# Patient Record
Sex: Male | Born: 1945 | Race: White | Hispanic: No | Marital: Married | Smoking: Current every day smoker
Health system: Southern US, Community
[De-identification: ages and names within clinical notes are randomized; demographics above are authoritative.]

## PROBLEM LIST (undated history)

## (undated) DIAGNOSIS — G4730 Sleep apnea, unspecified: Secondary | ICD-10-CM

## (undated) DIAGNOSIS — E785 Hyperlipidemia, unspecified: Secondary | ICD-10-CM

## (undated) DIAGNOSIS — K219 Gastro-esophageal reflux disease without esophagitis: Secondary | ICD-10-CM

## (undated) DIAGNOSIS — I10 Essential (primary) hypertension: Secondary | ICD-10-CM

## (undated) DIAGNOSIS — Z8719 Personal history of other diseases of the digestive system: Secondary | ICD-10-CM

## (undated) DIAGNOSIS — M1990 Unspecified osteoarthritis, unspecified site: Secondary | ICD-10-CM

## (undated) HISTORY — DX: Hyperlipidemia, unspecified: E78.5

## (undated) HISTORY — DX: Essential (primary) hypertension: I10

## (undated) HISTORY — DX: Sleep apnea, unspecified: G47.30

## (undated) HISTORY — DX: Personal history of other diseases of the digestive system: Z87.19

## (undated) HISTORY — DX: Unspecified osteoarthritis, unspecified site: M19.90

## (undated) HISTORY — DX: Gastro-esophageal reflux disease without esophagitis: K21.9

---

## 2003-01-09 ENCOUNTER — Ambulatory Visit (HOSPITAL_COMMUNITY): Admission: RE | Admit: 2003-01-09 | Discharge: 2003-01-09 | Payer: Self-pay | Admitting: Family Medicine

## 2003-01-09 ENCOUNTER — Encounter: Payer: Self-pay | Admitting: Family Medicine

## 2003-04-26 ENCOUNTER — Encounter: Admission: RE | Admit: 2003-04-26 | Discharge: 2003-04-26 | Payer: Self-pay | Admitting: Family Medicine

## 2005-03-06 ENCOUNTER — Encounter: Admission: RE | Admit: 2005-03-06 | Discharge: 2005-03-06 | Payer: Self-pay | Admitting: Orthopedic Surgery

## 2007-10-20 ENCOUNTER — Ambulatory Visit: Payer: Self-pay | Admitting: Cardiology

## 2008-05-31 ENCOUNTER — Encounter (INDEPENDENT_AMBULATORY_CARE_PROVIDER_SITE_OTHER): Payer: Self-pay | Admitting: *Deleted

## 2008-08-16 ENCOUNTER — Ambulatory Visit: Payer: Self-pay | Admitting: Gastroenterology

## 2008-08-29 ENCOUNTER — Ambulatory Visit: Payer: Self-pay | Admitting: Gastroenterology

## 2008-08-29 ENCOUNTER — Encounter: Payer: Self-pay | Admitting: Gastroenterology

## 2008-09-03 ENCOUNTER — Encounter: Payer: Self-pay | Admitting: Gastroenterology

## 2009-05-23 ENCOUNTER — Encounter
Admission: RE | Admit: 2009-05-23 | Discharge: 2009-05-23 | Payer: Self-pay | Admitting: Physical Medicine and Rehabilitation

## 2009-06-14 ENCOUNTER — Encounter: Admission: RE | Admit: 2009-06-14 | Discharge: 2009-09-12 | Payer: Self-pay | Admitting: Specialist

## 2009-08-14 ENCOUNTER — Encounter: Payer: Self-pay | Admitting: Cardiology

## 2009-08-14 DIAGNOSIS — G4730 Sleep apnea, unspecified: Secondary | ICD-10-CM | POA: Insufficient documentation

## 2009-08-14 DIAGNOSIS — K219 Gastro-esophageal reflux disease without esophagitis: Secondary | ICD-10-CM

## 2009-08-14 DIAGNOSIS — E785 Hyperlipidemia, unspecified: Secondary | ICD-10-CM | POA: Insufficient documentation

## 2009-08-14 DIAGNOSIS — M1990 Unspecified osteoarthritis, unspecified site: Secondary | ICD-10-CM

## 2009-08-14 DIAGNOSIS — I10 Essential (primary) hypertension: Secondary | ICD-10-CM | POA: Insufficient documentation

## 2009-08-22 ENCOUNTER — Ambulatory Visit: Payer: Self-pay | Admitting: Cardiology

## 2009-08-22 DIAGNOSIS — F17200 Nicotine dependence, unspecified, uncomplicated: Secondary | ICD-10-CM

## 2009-09-12 ENCOUNTER — Inpatient Hospital Stay (HOSPITAL_COMMUNITY): Admission: RE | Admit: 2009-09-12 | Discharge: 2009-09-14 | Payer: Self-pay | Admitting: Specialist

## 2009-10-08 ENCOUNTER — Encounter: Admission: RE | Admit: 2009-10-08 | Discharge: 2009-12-20 | Payer: Self-pay | Admitting: Specialist

## 2010-04-25 NOTE — Letter (Signed)
Summary: [facility] Surgical Clearance   [facility] Surgical Clearance   Imported By: Roderic Ovens 08/31/2009 15:47:44  _____________________________________________________________________  External Attachment:    Type:   Image     Comment:   External Document

## 2010-04-25 NOTE — Assessment & Plan Note (Signed)
Summary: Steven Serrano   Visit Type:  Follow-up Primary Provider:  Dr. Vernon Prey  CC:  Pre Op, HTN, and Hyperlipidemia.  History of Present Illness: The patient presents for preoperative evaluation. He has a history of multiple cardiovascular risk factors. He is now being considered for lumbar back surgery. He is limited in his activities. He is able to walk 50 yards before developing back pain. With this level of activity he does not develop chest discomfort, neck or arm discomfort. He does not have shortness of breath and has no resting complaints such as PND or orthopnea. He is not describing palpitations, presyncope or syncope. He said no swelling but has had weight gain from inactivity.  Allergies (verified): No Known Drug Allergies  Past History:  Past Medical History:  Hyperlipidemia x10 years, hypertension x10 years,   previous gastroesophageal reflux disease, ulcerative colitis years ago,   sleep apnea but not treated with CPAP, and osteoarthritis.      Past Surgical History: Reviewed history from 08/14/2009 and no changes required. None.   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   He is married.  He has been smoking cigarettes, most recently for 6   years.  He quit for 14 years prior to this and smoked for many years  before that.  He is currently smoking half pack a day.  He does drink  alcohol.   Review of Systems       As stated in the HPI and negative for all other systems.   Vital Signs:  Patient profile:   65 year old male Height:      69 inches Weight:      248 pounds BMI:     36.76 Pulse rate:   72 / minute Resp:     16 per minute BP sitting:   138 / 78  (right arm)  Vitals Entered By: Marrion Coy, CNA (August 22, 2009 9:43 AM)  Physical Exam  General:  Well developed, well nourished, in no acute distress. Head:  normocephalic and atraumatic Eyes:  PERRLA/EOM intact; conjunctiva and lids normal. Mouth:  Teeth,  gums and palate normal. Oral mucosa normal. Neck:  Neck supple, no JVD. No masses, thyromegaly or abnormal cervical nodes. Chest Wall:  no deformities or breast masses noted Lungs:  Clear bilaterally to auscultation and percussion. Abdomen:  Bowel sounds positive; abdomen soft and non-tender without masses, organomegaly, or hernias noted. No hepatosplenomegaly, obese Msk:  Back normal, normal gait. Muscle strength and tone normal. Extremities:  No clubbing or cyanosis. Neurologic:  Alert and oriented x 3. Skin:  Intact without lesions or rashes. Cervical Nodes:  no significant adenopathy Axillary Nodes:  no significant adenopathy Inguinal Nodes:  no significant adenopathy Psych:  Normal affect.   Detailed Cardiovascular Exam  Neck    Carotids: Carotids full and equal bilaterally without bruits.      Neck Veins: Normal, no JVD.    Heart    Inspection: no deformities or lifts noted.      Palpation: normal PMI with no thrills palpable.      Auscultation: regular rate and rhythm, S1, S2 without murmurs, rubs, gallops, or clicks.    Vascular    Abdominal Aorta: no palpable masses, pulsations, or audible bruits.      Femoral Pulses: normal femoral pulses bilaterally.      Pedal Pulses: normal pedal pulses bilaterally.      Radial Pulses: normal radial pulses bilaterally.  Peripheral Circulation: no clubbing, cyanosis, or edema noted with normal capillary refill.     EKG  Procedure date:  08/22/2009  Findings:      sinus rhythm, rate 75, axis within normal limits, intervals within normal limits, early transition in lead V2, no acute ST-T wave changes.  Impression & Recommendations:  Problem # 1:  PREOPERATIVE EXAMINATION (ICD-V72.84) According to ACC/AHA guidelines the patient has no high-risk findings. The surgery he is scheduled for is a moderate risk procedure. He does have a low functional level. According to the algorithm no further testing is indicated. The patient  would be at acceptable risk for the planned surgery with routine precautions. He and I did have a discussion that he use tobacco, sedentary lifestyle, hypertension and dyslipidemia gives him a moderate risk 5 year possibility of cardiovascular events.  Problem # 2:  HYPERTENSION (ICD-401.9) His blood pressure is controlled and he will continue the meds as listed.  Problem # 3:  HYPERLIPIDEMIA (ICD-272.4) I reviewed his lipids. His HDL was 37 and LDL 99. This is being managed by Dr. Christell Constant.  Problem # 4:  TOBACCO ABUSE (ICD-305.1) I discussed the need to stop smoking. The patient wants to try cold [place].

## 2010-06-09 LAB — CBC
HCT: 46.8 % (ref 39.0–52.0)
Hemoglobin: 15.7 g/dL (ref 13.0–17.0)
MCH: 29.7 pg (ref 26.0–34.0)
MCHC: 33.5 g/dL (ref 30.0–36.0)
MCV: 89.6 fL (ref 78.0–100.0)
Platelets: 157 10*3/uL (ref 150–400)
RBC: 4.64 MIL/uL (ref 4.22–5.81)
RDW: 13 % (ref 11.5–15.5)
WBC: 8.7 10*3/uL (ref 4.0–10.5)

## 2010-06-09 LAB — COMPREHENSIVE METABOLIC PANEL
Alkaline Phosphatase: 53 U/L (ref 39–117)
CO2: 25 mEq/L (ref 19–32)
Creatinine, Ser: 0.97 mg/dL (ref 0.4–1.5)
GFR calc Af Amer: >60 mL/min (ref >60)
GFR calc non Af Amer: >60 mL/min (ref >60)
Glucose, Bld: 112 mg/dL — ABNORMAL HIGH (ref 70–99)
Sodium: 141 mEq/L (ref 135–145)
Total Protein: 6.9 g/dL (ref 6.0–8.3)

## 2010-06-09 LAB — URINALYSIS, ROUTINE W REFLEX MICROSCOPIC
Glucose, UA: NEGATIVE mg/dL
Ketones, ur: NEGATIVE mg/dL
Nitrite: NEGATIVE
Protein, ur: NEGATIVE mg/dL
pH: 7 (ref 5.0–8.0)

## 2010-06-09 LAB — PROTIME-INR
INR: 1.13 (ref 0.00–1.49)
Prothrombin Time: 14.4 seconds (ref 11.6–15.2)

## 2010-06-09 LAB — BASIC METABOLIC PANEL
Calcium: 8.9 mg/dL (ref 8.4–10.5)
Creatinine, Ser: 0.94 mg/dL (ref 0.4–1.5)
Glucose, Bld: 134 mg/dL — ABNORMAL HIGH (ref 70–99)
Sodium: 137 mEq/L (ref 135–145)

## 2010-06-09 LAB — APTT: aPTT: 29 seconds (ref 24–37)

## 2010-06-09 LAB — SURGICAL PCR SCREEN
MRSA, PCR: NEGATIVE
Staphylococcus aureus: NEGATIVE

## 2010-08-06 NOTE — Assessment & Plan Note (Signed)
[facility]                            CARDIOLOGY OFFICE NOTE   Steven Serrano, Steven Serrano                       MRN:          [id number]  DATE:10/20/2007                            DOB:          Aug 05, 1945    PRIMARY CARE PHYSICIAN:  Ernestina Penna, MD.   REASON FOR PRESENTATION:  Evaluate patient with abnormal EKG and  multiple cardiovascular risk factors.   HISTORY OF PRESENT ILLNESS:  The patient presents for followup.  I last  saw him in 2004.  He is having some dyspnea and multiple cardiovascular  risk factors.  He did have a stress perfusion study, demonstrated an EF  of 60% with no evidence of ischemia or infarct.  He was recently found  to have an EKG with incomplete right bundle-branch block; this was  thought to be new.  Because of this and his ongoing multiple  cardiovascular risk factors, he is referred back.   The patient has had no new symptoms since I last saw him.  He is not  particularly active.  He was golfing, but not walking in the golf  course.  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  He is limited by some  back pain, which is recent onset, and some chronic knee pain.  I do not  think he does a lot of activity but could climb a flight of stairs if  needed.  With this level of activity, he denies any chest pressure, neck  discomfort, arm discomfort, activity-induced nausea, vomiting, or  excessive diaphoresis.  He does not have any palpitations, presyncope,  or syncope.  He does not have any PND or orthopnea.   The patient continues to smoke cigarettes.  He has not lost weight.  He  is having his lipids aggressively managed and his blood pressure  controlled.  He brought a blood pressure diary today, and it  demonstrates his blood pressure is very well controlled in the 110s to  130s systolic and diastolics well below 90.   PAST MEDICAL HISTORY:  Hyperlipidemia x8 years, hypertension x8 years,  previous gastroesophageal reflux  disease, ulcerative colitis years ago,  sleep apnea but not treated with CPAP, and osteoarthritis.   PAST SURGICAL HISTORY:  None.   ALLERGIES:  None.   MEDICATIONS:  Prilosec, omega-3, Aleve, Darvocet, etodolac, fenofibrate  200 mg daily, aspirin 81 mg daily, hydrochlorothiazide 12.5 mg daily,  lisinopril 20 mg daily, gabapentin, Niaspan 1000 mg at bedtime, and  simvastatin 80 mg at bedtime.   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  He is married.  He has been smoking cigarettes, most recently for 4  years.  He quit for 14 years prior to this and smoked for many years  before that.  He is currently smoking half pack a day.  He does drink  alcohol.   FAMILY HISTORY:  Noncontributory for early coronary disease.  His father  died of [place] spotted fever at 47.  His mother is still alive  at 10.  He had 1 brother only who died of  a brain hemorrhage at 19.   REVIEW OF SYSTEMS:  As stated in the HPI, otherwise negative for all  other systems.   PHYSICAL EXAMINATION:  GENERAL:  The patient is in no acute distress.  VITAL SIGNS:  Blood pressure 140/84, heart rate 63 and regular, and  weight 255 pounds.  HEENT:  Eyes unremarkable.  Pupils equal, round, and reactive to light.  Fundi not visualized.  Oral mucosa unremarkable.  NECK:  No jugular venous distention at 45 degrees.  Carotid upstroke  brisk and symmetric.  No bruits, no thyromegaly.  LYMPHATICS:  No cervical, axillary, or inguinal adenopathy.  LUNGS:  Clear to auscultation bilaterally.  BACK:  No costovertebral angle tenderness.  CHEST:  Unremarkable.  HEART:  PMI not displaced or sustained.  S1 and S2 within normal limits.  No S3, no S4, no clicks, no rubs.  A 2/6 apical systolic murmur,  nonradiating and early peaking and brief.  No diastolic murmur.  ABDOMEN:  Morbidly obese, positive bowel sounds normal in frequency and  pitch.  No bruits, no rebound, no guarding, no midline  pulsatile mass,  no hepatomegaly, no splenomegaly.  SKIN:  No rashes, no nodules.  EXTREMITIES:  Pulses 2+ throughout.  No edema, no cyanosis, no clubbing.  NEURO:  Oriented to person, place, and time.  Cranial nerves II through  XII grossly intact.  Motor grossly intact.   EKG - sinus bradycardia, rate 51, axis within normal limits, intervals  within normal limits, RSR prime V1 and V2, no acute ST-T wave changes.   ASSESSMENT/PLAN:  1. Abnormal electrocardiogram.  The patient's electrocardiogram is in      regular sinus rhythm prime in V1 and V2.  It does not really meet      criteria for an incomplete right bundle-branch block.  It is really      not different from the electrocardiograms I have previously.  Would      not be indicative of obstructive coronary disease.  He does not      have any symptoms consistent with obstructive coronary disease.  I      do not think cardiovascular testing is warranted.  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  I had a long and frank discussion with him about this.      Again, though I do not think cardiovascular testing would be      helpful, I do think he is at risk for acute myocardial infarction      or cardiovascular events if he does not change some things.  2. Obesity.  We discussed about the need to lose weight with diet.      Provided he can get his orthopedic problems treated, he would need      exercise as well.  3. Tobacco.  We had a long discussion (greater than 3 minutes) about      the need to stop smoking altogether.  4. Hypertension.  Blood pressure is controlled, and he will continue      the medications as listed.  5. Dyslipidemia.  I applaud Dr. Kathi Der efforts for lipid management      with this aggressive      therapy.  The patient needs to participate as well.  6. Followup.  This patient can come back to  see me as  needed.     Rollene Rotunda, MD, [facility]   Electronically Signed    JH/MedQ  DD: 10/20/2007  DT: 10/21/2007  Job #: [id number]   cc:   Ernestina Penna, M.D.

## 2010-10-30 ENCOUNTER — Encounter: Payer: Self-pay | Admitting: Cardiology

## 2010-11-25 IMAGING — CT CT CHEST W/O CM
2 of 4 series · 15 of 36 positions shown, 18 images · non-contrast
Comparison: 09/05/2009

CLINICAL DATA: Abnormal chest x-ray.  Possible pulmonary nodule.

CT CHEST WITHOUT CONTRAST
TECHNIQUE: Multidetector CT imaging of the chest was performed
following the standard protocol without IV contrast.

[Series 2: chest w/o st · axial · non-contrast · 0.89mm/px · z∈[-271,-16]mm · 12 of 61 slices shown, 15 images]
[im 5/61  mediastinal]
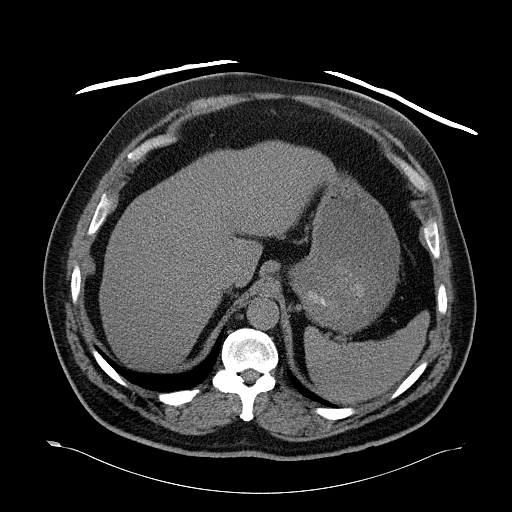
[im 5/61  lung]
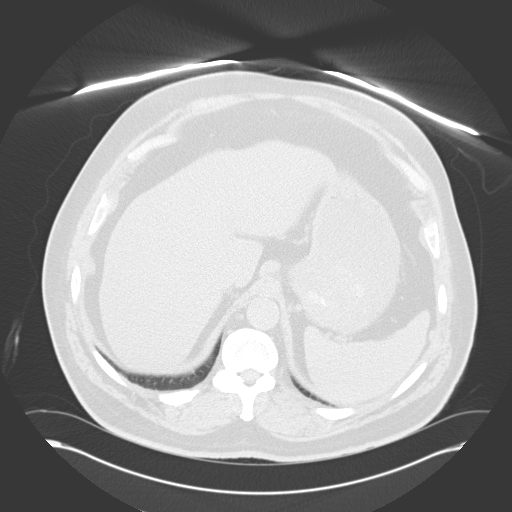
[im 10/61  lung]
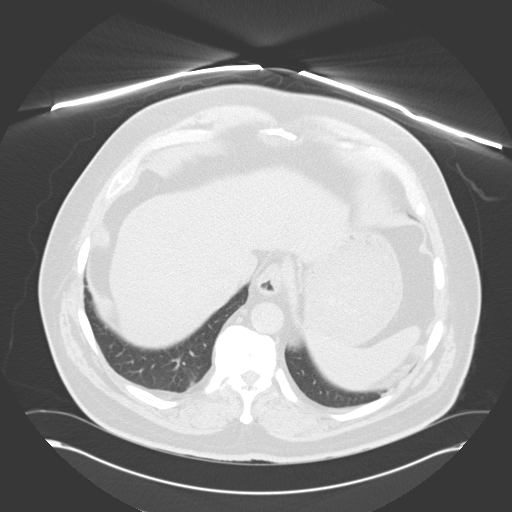
[im 14/61  lung]
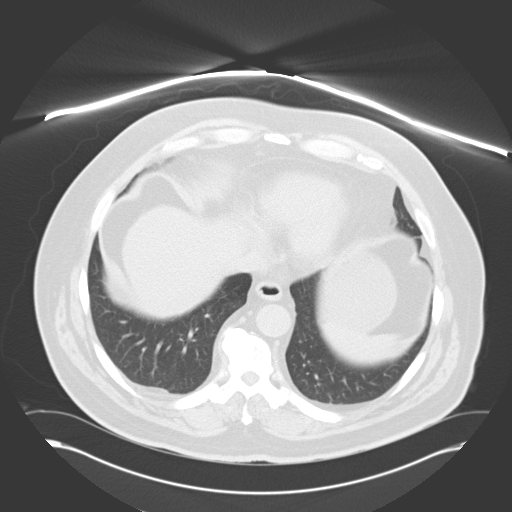
[im 19/61  lung]
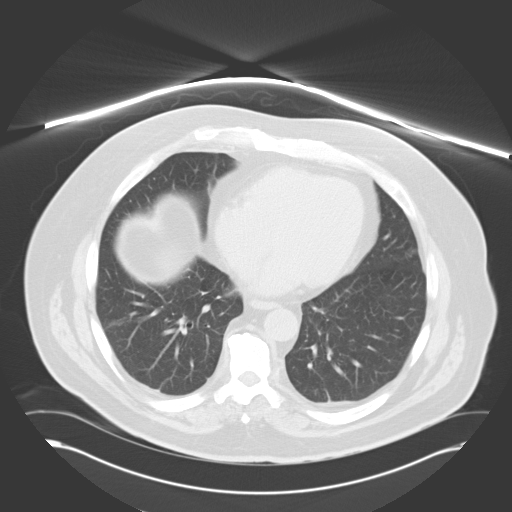
[im 24/61  mediastinal]
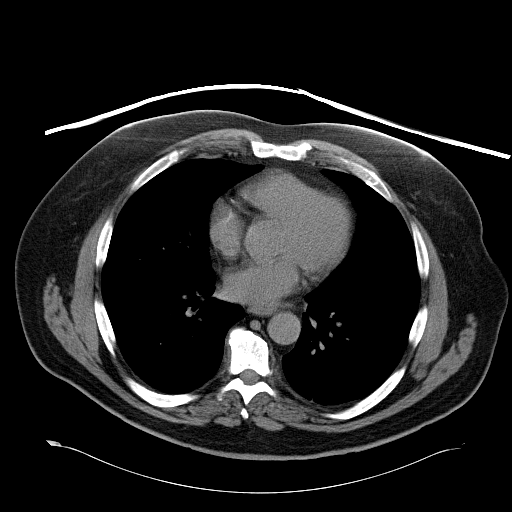
[im 24/61  lung]
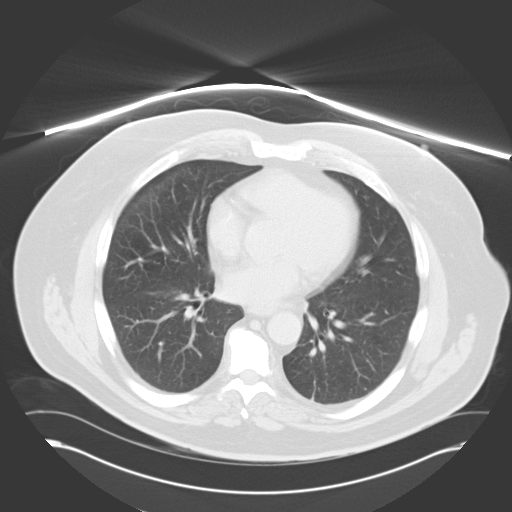
[im 28/61  lung]
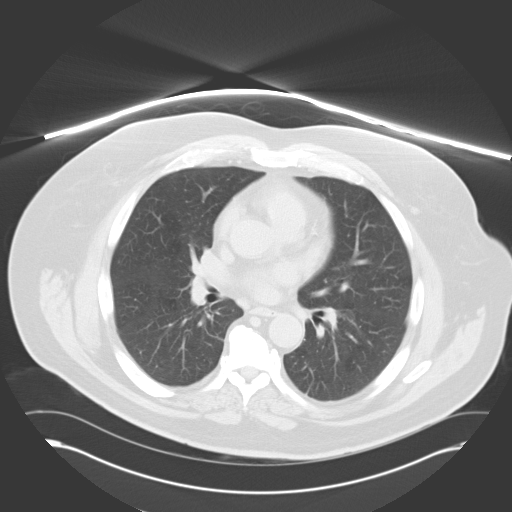
[im 33/61  lung]
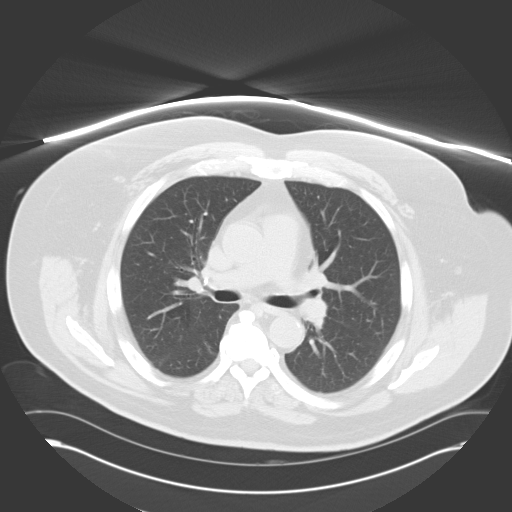
[im 37/61  lung]
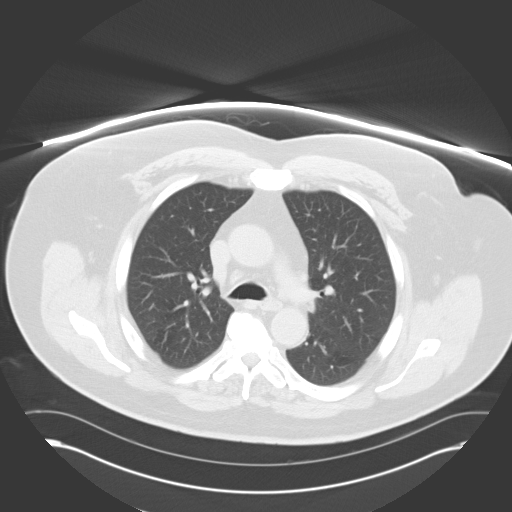
[im 42/61  mediastinal]
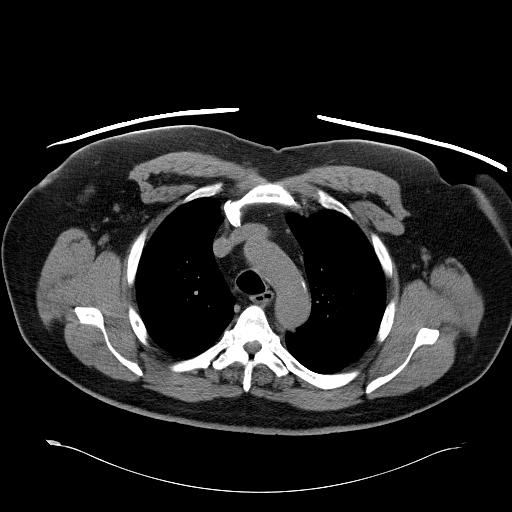
[im 42/61  lung]
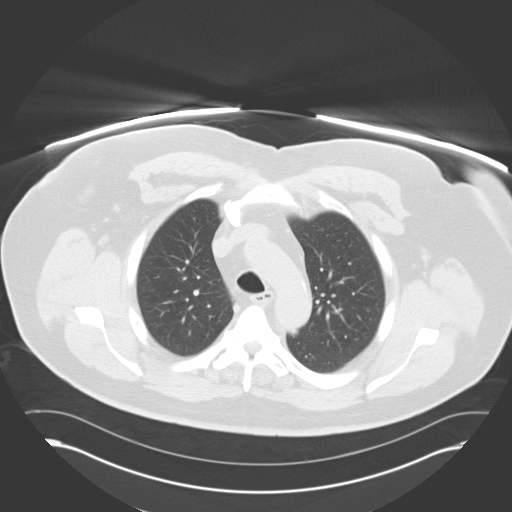
[im 47/61  lung]
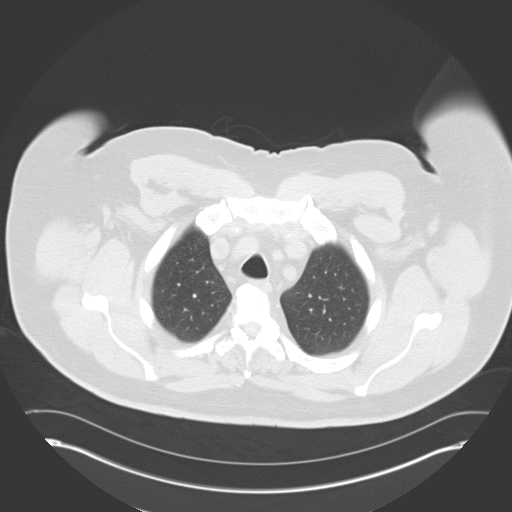
[im 51/61  lung]
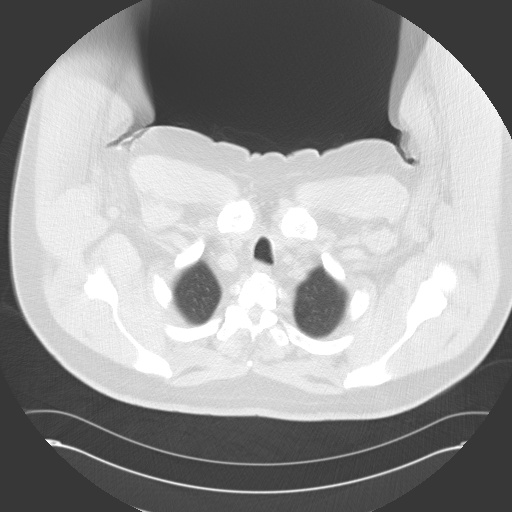
[im 56/61  lung]
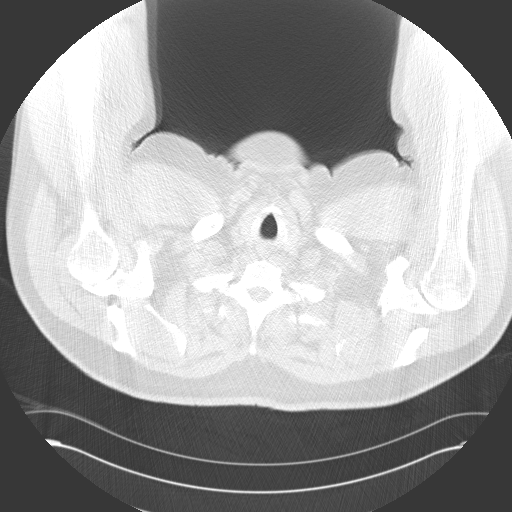

[Series 602: <mpr thick range> · coronal · 0.89mm/px · 3 of 102 slices shown]
[im 21/102  lung]
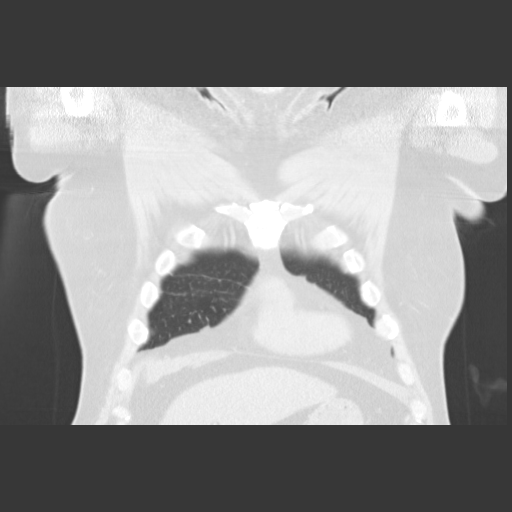
[im 41/102  lung]
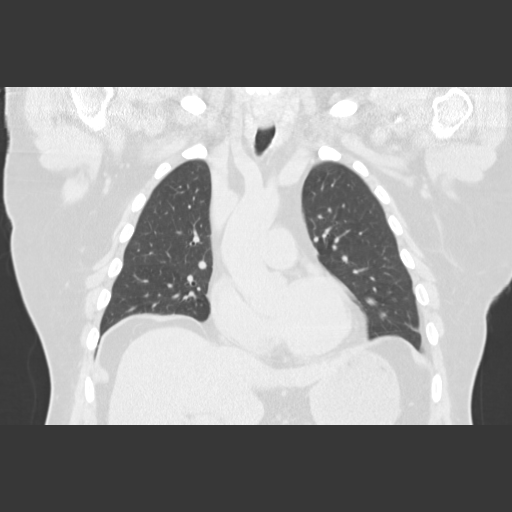
[im 61/102  lung]
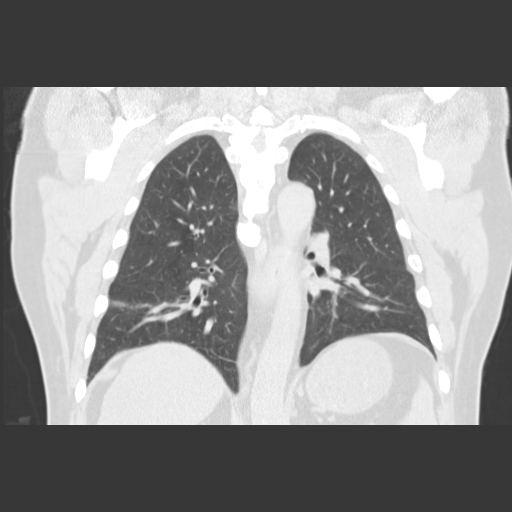

[15 of 36 positions shown; findings below may reference images not displayed]

FINDINGS: There are no pulmonary nodules within the lungs.  In
particular, no nodules seen in the left upper lobe.  There is a
sclerotic focus within the posterior left fourth rib, likely a bone
island.  The density questioned on prior chest x-ray corresponds to
this lesion.  Linear densities are seen in both lower lobes and
lung bases, which represent subsegmental atelectasis or scarring.
Calcified lymph nodes in the right hilum compatible with old
granulomatous disease.  No mediastinal, hilar, or axillary
adenopathy.  Heart is normal size. Aorta is normal caliber.
Visualized thyroid and chest wall soft tissues unremarkable.

Imaging into the upper abdomen shows no acute findings.  Flowing
anterior osteophytes are noted throughout the thoracic spine
compatible with diffuse idiopathic skeletal hyperostosis.  No acute
bony abnormality.
IMPRESSION: No evidence of pulmonary nodule, in particular in the area
questioned in the left upper lobe.  There is a bone island in the
posterior left fourth rib causing the nodular density on chest x-
ray.

No acute findings in the chest.

## 2013-12-20 ENCOUNTER — Ambulatory Visit (INDEPENDENT_AMBULATORY_CARE_PROVIDER_SITE_OTHER): Payer: Medicare Other | Admitting: Family

## 2013-12-20 ENCOUNTER — Encounter: Payer: Self-pay | Admitting: Family

## 2013-12-20 VITALS — BP 123/74 | HR 78 | Temp 98.7°F | Ht 69.0 in | Wt 251.4 lb

## 2013-12-20 DIAGNOSIS — J069 Acute upper respiratory infection, unspecified: Secondary | ICD-10-CM

## 2013-12-20 MED ORDER — ALBUTEROL SULFATE HFA 108 (90 BASE) MCG/ACT IN AERS: 2.0000 | INHALATION_SPRAY | Freq: Four times a day (QID) | RESPIRATORY_TRACT | Status: DC | PRN

## 2013-12-20 MED ORDER — METHYLPREDNISOLONE (PAK) 4 MG PO TABS: ORAL_TABLET | ORAL | Status: DC

## 2013-12-20 MED ORDER — BENZONATATE 200 MG PO CAPS: 200.0000 mg | ORAL_CAPSULE | Freq: Three times a day (TID) | ORAL | Status: DC | PRN

## 2013-12-20 NOTE — Progress Notes (Signed)
Subjective:    Patient ID: Steven Serrano, male    DOB: Jul 22, 1945, 68 y.o.   MRN: [id number]  Cough This is a new problem. The current episode started in the past 7 days (Thursday night). The problem has been waxing and waning. The problem occurs every few minutes. The cough is non-productive. Associated symptoms include rhinorrhea, shortness of breath and wheezing. Pertinent negatives include no chills, ear congestion, ear pain, fever, headaches, hemoptysis, nasal congestion, postnasal drip or sore throat. The symptoms are aggravated by lying down. Treatments tried: Mucinex. The treatment provided mild relief. There is no history of asthma or COPD.      Review of Systems  Constitutional: Negative.  Negative for fever and chills.  HENT: Positive for rhinorrhea. Negative for ear pain, postnasal drip and sore throat.   Respiratory: Positive for cough, shortness of breath and wheezing. Negative for hemoptysis.   Cardiovascular: Negative.   Gastrointestinal: Negative.   Endocrine: Negative.   Genitourinary: Negative.   Musculoskeletal: Negative.   Neurological: Negative.  Negative for headaches.  Hematological: Negative.   Psychiatric/Behavioral: Negative.   All other systems reviewed and are negative.      Objective:   Physical Exam  Vitals reviewed. Constitutional: He is oriented to person, place, and time. He appears well-developed and well-nourished. No distress.  HENT:  Head: Normocephalic and atraumatic.  Right Ear: External ear normal.  Left Ear: External ear normal.  Nasal passage erythemas with mild swelling Oropharynx erythemas  Eyes: Pupils are equal, round, and reactive to light. Right eye exhibits no discharge. Left eye exhibits no discharge.  Neck: Normal range of motion. Neck supple. No thyromegaly present.  Cardiovascular: Normal rate, regular rhythm, normal heart sounds and intact distal pulses.   No murmur heard. Pulmonary/Chest: Effort normal and breath  sounds normal. No respiratory distress. He has no wheezes.  Abdominal: Soft. Bowel sounds are normal. He exhibits no distension. There is no tenderness.  Musculoskeletal: Normal range of motion. He exhibits no edema and no tenderness.  Neurological: He is alert and oriented to person, place, and time. He has normal reflexes. No cranial nerve deficit.  Skin: Skin is warm and dry. No rash noted. No erythema.  Psychiatric: He has a normal mood and affect. His behavior is normal. Judgment and thought content normal.    BP 123/74  Pulse 78  Temp(Src) 98.7 F (37.1 C) (Oral)  Ht 5\' 9"  (1.753 m)  Wt 251 lb 6.4 oz (114.034 kg)  BMI 37.11 kg/m2       Assessment & Plan:  1. URI (upper respiratory infection) -- Take meds as prescribed - Use a cool mist humidifier  -Use saline nose sprays frequently -Saline irrigations of the nose can be very helpful if done frequently.  * 4X daily for 1 week*  * Use of a nettie pot can be helpful with this. Follow directions with this* -Force fluids -For any cough or congestion  Use plain Mucinex- regular strength or max strength is fine   * Children- consult with Pharmacist for dosing -For fever or aces or pains- take tylenol or ibuprofen appropriate for age and weight.  * for fevers greater than 101 orally you may alternate ibuprofen and tylenol every  3 hours. -Throat lozenges if help - methylPREDNIsolone (MEDROL DOSPACK) 4 MG tablet; follow package directions  Dispense: 21 tablet; Refill: 0 - benzonatate (TESSALON) 200 MG capsule; Take 1 capsule (200 mg total) by mouth 3 (three) times daily as needed.  Dispense: 30 capsule;  Refill: 1 - albuterol (PROVENTIL HFA;VENTOLIN HFA) 108 (90 BASE) MCG/ACT inhaler; Inhale 2 puffs into the lungs every 6 (six) hours as needed for wheezing or shortness of breath.  Dispense: 1 Inhaler; Refill: 2  Jannifer Rodneyhristy Ellenora Talton, FNP

## 2013-12-20 NOTE — Patient Instructions (Signed)
Upper Respiratory Infection, Adult An upper respiratory infection (URI) is also known as the common cold. It is often caused by a type of germ (virus). Colds are easily spread (contagious). You can pass it to others by kissing, coughing, sneezing, or drinking out of the same glass. Usually, you get better in 1 or 2 weeks.  HOME CARE   Only take medicine as told by your doctor.  Use a warm mist humidifier or breathe in steam from a hot shower.  Drink enough water and fluids to keep your pee (urine) clear or pale yellow.  Get plenty of rest.  Return to work when your temperature is back to normal or as told by your doctor. You may use a face mask and wash your hands to stop your cold from spreading. GET HELP RIGHT AWAY IF:   After the first few days, you feel you are getting worse.  You have questions about your medicine.  You have chills, shortness of breath, or brown or red spit (mucus).  You have yellow or brown snot (nasal discharge) or pain in the face, especially when you bend forward.  You have a fever, puffy (swollen) neck, pain when you swallow, or white spots in the back of your throat.  You have a bad headache, ear pain, sinus pain, or chest pain.  You have a high-pitched whistling sound when you breathe in and out (wheezing).  You have a lasting cough or cough up blood.  You have sore muscles or a stiff neck. MAKE SURE YOU:   Understand these instructions.  Will watch your condition.  Will get help right away if you are not doing well or get worse. Document Released: 08/27/2007 Document Revised: 06/02/2011 Document Reviewed: 06/15/2013 ExitCare Patient Information 2015 [employer]. This information is not intended to replace advice given to you by your health care provider. Make sure you discuss any questions you have with your health care provider.  \ - Take meds as prescribed - Use a cool mist humidifier  -Use saline nose sprays frequently -Saline  irrigations of the nose can be very helpful if done frequently.  * 4X daily for 1 week*  * Use of a nettie pot can be helpful with this. Follow directions with this* -Force fluids -For any cough or congestion  Use plain Mucinex- regular strength or max strength is fine   * Children- consult with Pharmacist for dosing -For fever or aces or pains- take tylenol or ibuprofen appropriate for age and weight.  * for fevers greater than 101 orally you may alternate ibuprofen and tylenol every  3 hours. -Throat lozenges if help   Kyel Purk, FNP   

## 2013-12-24 ENCOUNTER — Ambulatory Visit (INDEPENDENT_AMBULATORY_CARE_PROVIDER_SITE_OTHER): Payer: Medicare Other | Admitting: Family

## 2013-12-24 VITALS — BP 119/62 | HR 64 | Temp 97.5°F | Ht 69.0 in | Wt 251.0 lb

## 2013-12-24 DIAGNOSIS — R059 Cough, unspecified: Secondary | ICD-10-CM

## 2013-12-24 DIAGNOSIS — J069 Acute upper respiratory infection, unspecified: Secondary | ICD-10-CM

## 2013-12-24 DIAGNOSIS — R05 Cough: Secondary | ICD-10-CM

## 2013-12-24 MED ORDER — HYDROCODONE-HOMATROPINE 5-1.5 MG/5ML PO SYRP: 5.0000 mL | ORAL_SOLUTION | Freq: Three times a day (TID) | ORAL | Status: DC | PRN

## 2013-12-24 MED ORDER — AMOXICILLIN-POT CLAVULANATE 875-125 MG PO TABS: 1.0000 | ORAL_TABLET | Freq: Two times a day (BID) | ORAL | Status: DC

## 2013-12-24 NOTE — Patient Instructions (Signed)
Upper Respiratory Infection, Adult An upper respiratory infection (URI) is also known as the common cold. It is often caused by a type of germ (virus). Colds are easily spread (contagious). You can pass it to others by kissing, coughing, sneezing, or drinking out of the same glass. Usually, you get better in 1 or 2 weeks.  HOME CARE   Only take medicine as told by your doctor.  Use a warm mist humidifier or breathe in steam from a hot shower.  Drink enough water and fluids to keep your pee (urine) clear or pale yellow.  Get plenty of rest.  Return to work when your temperature is back to normal or as told by your doctor. You may use a face mask and wash your hands to stop your cold from spreading. GET HELP RIGHT AWAY IF:   After the first few days, you feel you are getting worse.  You have questions about your medicine.  You have chills, shortness of breath, or brown or red spit (mucus).  You have yellow or brown snot (nasal discharge) or pain in the face, especially when you bend forward.  You have a fever, puffy (swollen) neck, pain when you swallow, or white spots in the back of your throat.  You have a bad headache, ear pain, sinus pain, or chest pain.  You have a high-pitched whistling sound when you breathe in and out (wheezing).  You have a lasting cough or cough up blood.  You have sore muscles or a stiff neck. MAKE SURE YOU:   Understand these instructions.  Will watch your condition.  Will get help right away if you are not doing well or get worse. Document Released: 08/27/2007 Document Revised: 06/02/2011 Document Reviewed: 06/15/2013 Exit[employer] Patient Information 2015 [employer]. This information is not intended to replace advice given to you by your health care provider. Make sure you discuss any questions you have with your health care provider.  - Take meds as prescribed - Use a cool mist humidifier  -Use saline nose sprays frequently -Saline  irrigations of the nose can be very helpful if done frequently.  * 4X daily for 1 week*  * Use of a nettie pot can be helpful with this. Follow directions with this* -Force fluids -For any cough or congestion  Use plain Mucinex- regular strength or max strength is fine   * Children- consult with Pharmacist for dosing -For fever or aces or pains- take tylenol or ibuprofen appropriate for age and weight.  * for fevers greater than 101 orally you may alternate ibuprofen and tylenol every  3 hours. -Throat lozenges if help -New toothbrush in 3 days   Christy Hawks, FNP  

## 2013-12-24 NOTE — Progress Notes (Signed)
Subjective:    Patient ID: Steven Serrano, male    DOB: 10-04-1945, 68 y.o.   MRN: [id number]  Pt was seen in office on 12/20/13 and states he has gotten worse.  Cough This is a recurrent problem. The current episode started 1 to 4 weeks ago. The problem has been waxing and waning. The problem occurs constantly. The cough is productive of sputum. Associated symptoms include postnasal drip and rhinorrhea. Pertinent negatives include no chills, ear congestion, ear pain, fever, headaches, nasal congestion, rash, sore throat, shortness of breath or wheezing. The symptoms are aggravated by lying down. He has tried rest, prescription cough suppressant, oral steroids and leukotriene antagonists for the symptoms. The treatment provided mild relief. There is no history of asthma or COPD.      Review of Systems  Constitutional: Negative.  Negative for fever and chills.  HENT: Positive for postnasal drip and rhinorrhea. Negative for ear pain and sore throat.   Respiratory: Positive for cough. Negative for shortness of breath and wheezing.   Cardiovascular: Negative.   Gastrointestinal: Negative.   Endocrine: Negative.   Genitourinary: Negative.   Musculoskeletal: Negative.   Skin: Negative for rash.  Neurological: Negative.  Negative for headaches.  Hematological: Negative.   Psychiatric/Behavioral: Negative.   All other systems reviewed and are negative.      Objective:   Physical Exam  Vitals reviewed. Constitutional: He is oriented to person, place, and time. He appears well-developed and well-nourished. No distress.  HENT:  Head: Normocephalic.  Right Ear: External ear normal.  Left Ear: External ear normal.  Nasal passage erythemas with mild swelling Oropharynx erythemas  Eyes: Pupils are equal, round, and reactive to light. Right eye exhibits no discharge. Left eye exhibits no discharge.  Neck: Normal range of motion. Neck supple. No thyromegaly present.  Cardiovascular: Normal  rate, regular rhythm, normal heart sounds and intact distal pulses.   No murmur heard. Pulmonary/Chest: Effort normal. No respiratory distress. He has wheezes.  Abdominal: Soft. Bowel sounds are normal. He exhibits no distension. There is no tenderness.  Musculoskeletal: Normal range of motion. He exhibits no edema and no tenderness.  Neurological: He is alert and oriented to person, place, and time. He has normal reflexes. No cranial nerve deficit.  Skin: Skin is warm and dry. No rash noted. No erythema.  Psychiatric: He has a normal mood and affect. His behavior is normal. Judgment and thought content normal.   BP 119/62  Pulse 64  Temp(Src) 97.5 F (36.4 C) (Oral)  Ht 5\' 9"  (1.753 m)  Wt 251 lb (113.853 kg)  BMI 37.05 kg/m2  SpO2 97%    Assessment & Plan:  1. URI (upper respiratory infection) -- Take meds as prescribed - Use a cool mist humidifier  -Use saline nose sprays frequently -Saline irrigations of the nose can be very helpful if done frequently.  * 4X daily for 1 week*  * Use of a nettie pot can be helpful with this. Follow directions with this* -Force fluids -For any cough or congestion  Use plain Mucinex- regular strength or max strength is fine   * Children- consult with Pharmacist for dosing -For fever or aces or pains- take tylenol or ibuprofen appropriate for age and weight.  * for fevers greater than 101 orally you may alternate ibuprofen and tylenol every  3 hours. -Throat lozenges if help - amoxicillin-clavulanate (AUGMENTIN) 875-125 MG per tablet; Take 1 tablet by mouth 2 (two) times daily.  Dispense: 20 tablet; Refill:  0 - HYDROcodone-homatropine (HYCODAN) 5-1.5 MG/5ML syrup; Take 5 mLs by mouth every 8 (eight) hours as needed for cough.  Dispense: 120 mL; Refill: 0  2. Cough - HYDROcodone-homatropine (HYCODAN) 5-1.5 MG/5ML syrup; Take 5 mLs by mouth every 8 (eight) hours as needed for cough.  Dispense: 120 mL; Refill: 0  Jannifer Rodneyhristy Jaslynn Thome, FNP

## 2014-12-01 DIAGNOSIS — H31003 Unspecified chorioretinal scars, bilateral: Secondary | ICD-10-CM | POA: Diagnosis not present

## 2014-12-01 DIAGNOSIS — H52223 Regular astigmatism, bilateral: Secondary | ICD-10-CM | POA: Diagnosis not present

## 2014-12-01 DIAGNOSIS — H2513 Age-related nuclear cataract, bilateral: Secondary | ICD-10-CM | POA: Diagnosis not present

## 2014-12-01 DIAGNOSIS — H5213 Myopia, bilateral: Secondary | ICD-10-CM | POA: Diagnosis not present

## 2014-12-08 ENCOUNTER — Encounter: Payer: Self-pay | Admitting: Family Medicine

## 2014-12-08 ENCOUNTER — Ambulatory Visit (INDEPENDENT_AMBULATORY_CARE_PROVIDER_SITE_OTHER): Payer: Medicare Other | Admitting: Family Medicine

## 2014-12-08 ENCOUNTER — Encounter (INDEPENDENT_AMBULATORY_CARE_PROVIDER_SITE_OTHER): Payer: Self-pay

## 2014-12-08 ENCOUNTER — Ambulatory Visit (INDEPENDENT_AMBULATORY_CARE_PROVIDER_SITE_OTHER): Payer: Medicare Other

## 2014-12-08 VITALS — BP 161/85 | HR 63 | Temp 97.1°F | Ht 69.0 in | Wt 255.2 lb

## 2014-12-08 DIAGNOSIS — M25562 Pain in left knee: Secondary | ICD-10-CM

## 2014-12-08 NOTE — Patient Instructions (Signed)
Joint Injection  Care After  Refer to this sheet in the next few days. These instructions provide you with information on caring for yourself after you have had a joint injection. Your caregiver also may give you more specific instructions. Your treatment has been planned according to current medical practices, but problems sometimes occur. Call your caregiver if you have any problems or questions after your procedure.  After any type of joint injection, it is not uncommon to experience:  · Soreness, swelling, or bruising around the injection site.  · Mild numbness, tingling, or weakness around the injection site caused by the numbing medicine used before or with the injection.  It also is possible to experience the following effects associated with the specific agent after injection:  · Iodine-based contrast agents:  ¨ Allergic reaction (itching, hives, widespread redness, and swelling beyond the injection site).  · Corticosteroids (These effects are rare.):  ¨ Allergic reaction.  ¨ Increased blood sugar levels (If you have diabetes and you notice that your blood sugar levels have increased, notify your caregiver).  ¨ Increased blood pressure levels.  ¨ Mood swings.  · Hyaluronic acid in the use of viscosupplementation.  ¨ Temporary heat or redness.  ¨ Temporary rash and itching.  ¨ Increased fluid accumulation in the injected joint.  These effects all should resolve within a day after your procedure.   HOME CARE INSTRUCTIONS  · Limit yourself to light activity the day of your procedure. Avoid lifting heavy objects, bending, stooping, or twisting.  · Take prescription or over-the-counter pain medication as directed by your caregiver.  · You may apply ice to your injection site to reduce pain and swelling the day of your procedure. Ice may be applied 03-04 times:  ¨ Put ice in a plastic bag.  ¨ Place a towel between your skin and the bag.  ¨ Leave the ice on for no longer than 15-20 minutes each time.  SEEK  IMMEDIATE MEDICAL CARE IF:   · Pain and swelling get worse rather than better or extend beyond the injection site.  · Numbness does not go away.  · Blood or fluid continues to leak from the injection site.  · You have chest pain.  · You have swelling of your face or tongue.  · You have trouble breathing or you become dizzy.  · You develop a fever, chills, or severe tenderness at the injection site that last longer than 1 day.  MAKE SURE YOU:  · Understand these instructions.  · Watch your condition.  · Get help right away if you are not doing well or if you get worse.  Document Released: 11/21/2010 Document Revised: 06/02/2011 Document Reviewed: 11/21/2010  [employer]® Patient Information ©2015 [employer]. This information is not intended to replace advice given to you by your health care provider. Make sure you discuss any questions you have with your health care provider.

## 2014-12-08 NOTE — Progress Notes (Signed)
   HPI  Patient presents today evaluation of left knee pain  Patient spends that over the last 6 months she's had generalized anterior knee pain with popping symptoms. At times it gives way and he has any posterior knee pain.  He denies any swelling, erythema, or warmth of the knee.  He denies any fever, chills, sweats. He denies any trauma as well.  He states that he plays golf and that shifting his weight on it makes it hurt worse. He uses 3 different braces with improvement in each one.  PMH: Smoking status noted ROS: Per HPI  Objective: BP 161/85 mmHg  Pulse 63  Temp(Src) 97.1 F (36.2 C) (Oral)  Ht  (1.753 m)  Wt 255 lb 3.2 oz (115.758 kg)  BMI 37.67 kg/m2 Gen: NAD, alert, cooperative with exam HEENT: NCAT, EOMI, PERRL CV: RRR, good S1/S2, no murmur Resp: CTABL, no wheezes, non-labored Abd: SNTND, BS present, no guarding or organomegaly Ext: No edema, warm Neuro: Alert and oriented, No gross deficits  MSK: L knee without erythema, effusion, bruising, or gross deformity No joint line tenderness.  ligamentously intact to Lachman's and with varus and valgus stress.  Negative McMurray's test  Informed consent obtained.  Time out performed.  Area cleaned with iodine x 2 and wiped clear with alcohol swab.  Using 22G X 1 1/2 gauge needle 1 cc 80 mg/mL depo medrol and 4 cc's 1% marcaine were injected in into the knee via the medial approach.  Sterile bandage placed.  Patient tolerated procedure well.  No complications.      Assessment and plan:  # Left knee pain Likely meniscal tear, x-ray without significant OA but definitely some changes of mild OA. Calcified area that's possibly a meniscus calcification. Injected with steroid today Offered orthopedic referral but he declines. Recommended supportive care otherwise   Orders Placed This Encounter  Procedures  . DG Knee 1-2 Views Left    Standing Status: Future     Number of Occurrences:      Standing  Expiration Date: 02/07/2016    Order Specific Question:  Reason for Exam (SYMPTOM  OR DIAGNOSIS REQUIRED)    Answer:  knee pain, eval OA    Order Specific Question:  Preferred imaging location?    Answer:  Internal   Murtis Sink, MD [facility] 12/08/2014, 2:05 PM

## 2015-01-24 DIAGNOSIS — Z0289 Encounter for other administrative examinations: Secondary | ICD-10-CM

## 2015-03-23 ENCOUNTER — Encounter: Payer: Self-pay | Admitting: Gastroenterology

## 2015-12-04 ENCOUNTER — Telehealth: Payer: Self-pay | Admitting: Family Medicine

## 2015-12-04 NOTE — Telephone Encounter (Signed)
[place]

## 2016-02-27 ENCOUNTER — Ambulatory Visit (INDEPENDENT_AMBULATORY_CARE_PROVIDER_SITE_OTHER): Payer: Medicare Other | Admitting: Physician Assistant

## 2016-02-27 ENCOUNTER — Encounter: Payer: Self-pay | Admitting: Physician Assistant

## 2016-02-27 VITALS — BP 117/64 | HR 70 | Temp 98.8°F | Ht 69.0 in | Wt 259.4 lb

## 2016-02-27 DIAGNOSIS — J209 Acute bronchitis, unspecified: Secondary | ICD-10-CM | POA: Diagnosis not present

## 2016-02-27 MED ORDER — ALBUTEROL SULFATE HFA 108 (90 BASE) MCG/ACT IN AERS: 2.0000 | INHALATION_SPRAY | Freq: Four times a day (QID) | RESPIRATORY_TRACT | 0 refills | Status: DC | PRN

## 2016-02-27 MED ORDER — AZITHROMYCIN 250 MG PO TABS: ORAL_TABLET | ORAL | 0 refills | Status: DC

## 2016-02-27 MED ORDER — HYDROCODONE-HOMATROPINE 5-1.5 MG/5ML PO SYRP: 5.0000 mL | ORAL_SOLUTION | Freq: Four times a day (QID) | ORAL | 0 refills | Status: DC | PRN

## 2016-02-27 MED ORDER — METHYLPREDNISOLONE ACETATE 80 MG/ML IJ SUSP
80.0000 mg | Freq: Once | INTRAMUSCULAR | Status: AC
  Administered 2016-02-27: 80 mg via INTRAMUSCULAR

## 2016-02-27 MED ORDER — ALBUTEROL SULFATE (2.5 MG/3ML) 0.083% IN NEBU
2.5000 mg | INHALATION_SOLUTION | Freq: Once | RESPIRATORY_TRACT | Status: AC
  Administered 2016-02-27: 2.5 mg via RESPIRATORY_TRACT

## 2016-02-27 NOTE — Progress Notes (Signed)
BP 117/64   Pulse 70   Temp 98.8 F (37.1 C) (Oral)   Ht 5\' 9"  (1.753 m)   Wt 259 lb 6.4 oz (117.7 kg)   BMI 38.31 kg/m    Subjective:    Patient ID: Steven CaprioJerry D Hiser, male    DOB: June 28, 1945, 70 y.o.   MRN: [id number]  HPI: Steven Serrano is a 70 y.o. male presenting on 02/27/2016 for Cough Patient has had one week of cough and congestion that is increasing. He has had some fever and chills. His cough is associated with some wheezing. He has had postnasal drainage. States he has had sinusitis and bronchitis in the past.  Relevant past medical, surgical, family and social history reviewed and updated as indicated. Allergies and medications reviewed and updated.  Past Medical History:  Diagnosis Date  . History of gastroesophageal reflux (GERD)   . History of ulcerative colitis    years ago  . Hyperlipidemia    x10 years  . Hypertension    x10 years  . Osteoarthritis   . Sleep apnea    Not treated with CPAP    History reviewed. No pertinent surgical history.  Review of Systems  Constitutional: Positive for fatigue. Negative for appetite change.  HENT: Positive for sinus pressure and sore throat.   Eyes: Negative.  Negative for pain and visual disturbance.  Respiratory: Positive for shortness of breath and wheezing. Negative for cough and chest tightness.   Cardiovascular: Negative.  Negative for chest pain, palpitations and leg swelling.  Gastrointestinal: Negative.  Negative for abdominal pain, diarrhea, nausea and vomiting.  Endocrine: Negative.   Genitourinary: Negative.   Musculoskeletal: Positive for back pain and myalgias.  Skin: Negative.  Negative for color change and rash.  Neurological: Positive for headaches. Negative for weakness and numbness.  Psychiatric/Behavioral: Negative.       Medication List       Accurate as of 02/27/16  2:34 PM. Always use your most recent med list.          albuterol 108 (90 Base) MCG/ACT inhaler Commonly known as:   PROVENTIL HFA;VENTOLIN HFA Inhale 2 puffs into the lungs every 6 (six) hours as needed for wheezing or shortness of breath.   atorvastatin 20 MG tablet Commonly known as:  LIPITOR Take by mouth. Take 1/2 every evening   azithromycin 250 MG tablet Commonly known as:  ZITHROMAX Z-PAK As directed   Fish Oil 1000 MG Caps Take 1,000 mg by mouth. Take four at HS   HYDROcodone-homatropine 5-1.5 MG/5ML syrup Commonly known as:  HYCODAN Take 5 mLs by mouth every 6 (six) hours as needed for cough.   ibuprofen 800 MG tablet Commonly known as:  ADVIL,MOTRIN Take 800 mg by mouth. PRN   lisinopril-hydrochlorothiazide 20-25 MG tablet Commonly known as:  PRINZIDE,ZESTORETIC Take 1 tablet by mouth daily.   omeprazole 20 MG capsule Commonly known as:  PRILOSEC Take 20 mg by mouth daily.          Objective:    BP 117/64   Pulse 70   Temp 98.8 F (37.1 C) (Oral)   Ht 5\' 9"  (1.753 m)   Wt 259 lb 6.4 oz (117.7 kg)   BMI 38.31 kg/m   No Known Allergies  Physical Exam  Constitutional: He appears well-developed and well-nourished.  HENT:  Head: Normocephalic and atraumatic.  Right Ear: Hearing and tympanic membrane normal.  Left Ear: Hearing and tympanic membrane normal.  Nose: Mucosal edema and sinus tenderness present.  No nasal deformity. Right sinus exhibits frontal sinus tenderness. Left sinus exhibits frontal sinus tenderness.  Mouth/Throat: Posterior oropharyngeal erythema present.  Eyes: Conjunctivae and EOM are normal. Pupils are equal, round, and reactive to light. Right eye exhibits no discharge. Left eye exhibits no discharge.  Neck: Normal range of motion. Neck supple.  Cardiovascular: Normal rate, regular rhythm and normal heart sounds.   Pulmonary/Chest: Effort normal. No respiratory distress. He has no decreased breath sounds. He has wheezes. He has no rhonchi. He has no rales.  Abdominal: Soft. Bowel sounds are normal.  Musculoskeletal: Normal range of motion.    Skin: Skin is warm and dry.        Assessment & Plan:   1. Acute bronchitis, unspecified organism - albuterol (PROVENTIL) (2.5 MG/3ML) 0.083% nebulizer solution 2.5 mg; Take 3 mLs (2.5 mg total) by nebulization once. - methylPREDNISolone acetate (DEPO-MEDROL) injection 80 mg; Inject 1 mL (80 mg total) into the muscle once. - azithromycin (ZITHROMAX Z-PAK) 250 MG tablet; As directed  Dispense: 6 tablet; Refill: 0 - albuterol (PROVENTIL HFA;VENTOLIN HFA) 108 (90 Base) MCG/ACT inhaler; Inhale 2 puffs into the lungs every 6 (six) hours as needed for wheezing or shortness of breath.  Dispense: 1 Inhaler; Refill: 0 - HYDROcodone-homatropine (HYCODAN) 5-1.5 MG/5ML syrup; Take 5 mLs by mouth every 6 (six) hours as needed for cough.  Dispense: 120 mL; Refill: 0   Continue all other maintenance medications as listed above.  Follow up plan: Return if symptoms worsen or fail to improve.  No orders of the defined types were placed in this encounter.   Educational handout given for bronchitis  Remus LofflerAngel S. Kaesen Rodriguez PA-C [facility] [address]  [place], [place] [id number] [phone]   02/27/2016, 2:34 PM

## 2016-02-27 NOTE — Patient Instructions (Signed)

## 2016-04-24 ENCOUNTER — Telehealth: Payer: Self-pay | Admitting: Family Medicine

## 2016-04-25 ENCOUNTER — Encounter: Payer: Self-pay | Admitting: Family

## 2016-04-25 ENCOUNTER — Ambulatory Visit (INDEPENDENT_AMBULATORY_CARE_PROVIDER_SITE_OTHER): Payer: Medicare Other | Admitting: Family

## 2016-04-25 VITALS — BP 119/65 | HR 74 | Temp 98.3°F | Ht 69.0 in | Wt 259.0 lb

## 2016-04-25 DIAGNOSIS — R6889 Other general symptoms and signs: Secondary | ICD-10-CM | POA: Diagnosis not present

## 2016-04-25 DIAGNOSIS — J101 Influenza due to other identified influenza virus with other respiratory manifestations: Secondary | ICD-10-CM | POA: Diagnosis not present

## 2016-04-25 LAB — VERITOR FLU A/B WAIVED
INFLUENZA A: POSITIVE — AB
Influenza B: NEGATIVE

## 2016-04-25 MED ORDER — OSELTAMIVIR PHOSPHATE 75 MG PO CAPS: 75.0000 mg | ORAL_CAPSULE | Freq: Two times a day (BID) | ORAL | 0 refills | Status: DC

## 2016-04-25 NOTE — Patient Instructions (Signed)

## 2016-04-25 NOTE — Progress Notes (Signed)
   Subjective:    Patient ID: Steven Serrano, male    DOB: 1945-12-02, 71 y.o.   MRN: [id number]  Cough  This is a new problem. The current episode started yesterday. The problem has been gradually worsening. The problem occurs every few minutes. The cough is non-productive. Associated symptoms include chills, headaches, nasal congestion, postnasal drip, rhinorrhea, a sore throat and shortness of breath. Pertinent negatives include no ear congestion, ear pain, fever or myalgias. The symptoms are aggravated by lying down. He has tried rest and OTC cough suppressant for the symptoms. The treatment provided mild relief. There is no history of asthma or COPD.  Headache   Associated symptoms include coughing, rhinorrhea and a sore throat. Pertinent negatives include no ear pain or fever.      Review of Systems  Constitutional: Positive for chills. Negative for fever.  HENT: Positive for postnasal drip, rhinorrhea and sore throat. Negative for ear pain.   Respiratory: Positive for cough and shortness of breath.   Musculoskeletal: Negative for myalgias.  Neurological: Positive for headaches.  All other systems reviewed and are negative.      Objective:   Physical Exam  Constitutional: He is oriented to person, place, and time. He appears well-developed and well-nourished. No distress.  HENT:  Head: Normocephalic.  Right Ear: External ear normal.  Left Ear: External ear normal.  Nose: Mucosal edema and rhinorrhea present.  Mouth/Throat: Posterior oropharyngeal erythema present.  Eyes: Pupils are equal, round, and reactive to light. Right eye exhibits no discharge. Left eye exhibits no discharge.  Neck: Normal range of motion. Neck supple. No thyromegaly present.  Cardiovascular: Normal rate, regular rhythm, normal heart sounds and intact distal pulses.   No murmur heard. Pulmonary/Chest: Effort normal. No respiratory distress. He has wheezes in the right lower field and the left lower  field.  Abdominal: Soft. Bowel sounds are normal. He exhibits no distension. There is no tenderness.  Musculoskeletal: Normal range of motion. He exhibits no edema or tenderness.  Neurological: He is alert and oriented to person, place, and time. He has normal reflexes. No cranial nerve deficit.  Skin: Skin is warm and dry. No rash noted. No erythema.  Psychiatric: He has a normal mood and affect. His behavior is normal. Judgment and thought content normal.  Vitals reviewed.     BP 119/65   Pulse 74   Temp 98.3 F (36.8 C) (Oral)   Ht 5\' 9"  (1.753 m)   Wt 259 lb (117.5 kg)   BMI 38.25 kg/m      Assessment & Plan:  1. Flu-like symptoms - Veritor Flu A/B Waived  2. Influenza A -Force fluids -Rest -Good hand hygiene discussed  -Avoid crowds and immune compromise -RTO if wheezing or cough worsen - oseltamivir (TAMIFLU) 75 MG capsule; Take 1 capsule (75 mg total) by mouth 2 (two) times daily.  Dispense: 10 capsule; Refill: 0  Jannifer Rodneyhristy Niko Penson, FNP

## 2016-04-25 NOTE — Telephone Encounter (Signed)
appt scheduled

## 2017-01-08 ENCOUNTER — Encounter: Payer: Self-pay | Admitting: Family Medicine

## 2017-01-08 ENCOUNTER — Ambulatory Visit (INDEPENDENT_AMBULATORY_CARE_PROVIDER_SITE_OTHER): Payer: Medicare Other | Admitting: Family Medicine

## 2017-01-08 VITALS — BP 118/74 | HR 81 | Temp 101.3°F | Ht 69.0 in | Wt 259.0 lb

## 2017-01-08 DIAGNOSIS — A084 Viral intestinal infection, unspecified: Secondary | ICD-10-CM | POA: Diagnosis not present

## 2017-01-08 LAB — VERITOR FLU A/B WAIVED
INFLUENZA A: NEGATIVE
Influenza B: NEGATIVE

## 2017-01-08 MED ORDER — ONDANSETRON 4 MG PO TBDP: 4.0000 mg | ORAL_TABLET | Freq: Three times a day (TID) | ORAL | 0 refills | Status: DC | PRN

## 2017-01-08 NOTE — Progress Notes (Signed)
BP 118/74   Pulse 81   Temp (!) 101.3 F (38.5 C) (Oral)   Ht 5\' 9"  (1.753 m)   Wt 259 lb (117.5 kg)   BMI 38.25 kg/m    Subjective:    Patient ID: Steven CaprioJerry D Serrano, male    DOB: Aug 16, 1945, 71 y.o.   MRN: [id number]  HPI: Steven Serrano is a 71 y.o. male presenting on 01/08/2017 for Nausea, dry heaves, chills (symptoms began yesterday, feels better today)   HPI Nausea and dry heaves and chills and fever Patient started last night with dry heaves and chills and fevers.  He did have some improvement this morning but then he was having more dry heaves about 30 minutes ago.  He denies any diarrhea or actual vomiting.  He denies any blood in his stool or blood in vomit.  He denies any abdominal pain.  His fever today here in office is 101.3.  He does complain of some congestion and drainage.  He denies any sick contacts that he knows of.  Relevant past medical, surgical, family and social history reviewed and updated as indicated. Interim medical history since our last visit reviewed. Allergies and medications reviewed and updated.  Review of Systems  Constitutional: Negative for chills and fever.  Respiratory: Negative for shortness of breath and wheezing.   Cardiovascular: Negative for chest pain and leg swelling.  Gastrointestinal: Positive for nausea and vomiting. Negative for abdominal pain, blood in stool, constipation and diarrhea.  Musculoskeletal: Negative for back pain and gait problem.  Skin: Negative for rash.  All other systems reviewed and are negative.   Per HPI unless specifically indicated above     Objective:    BP 118/74   Pulse 81   Temp (!) 101.3 F (38.5 C) (Oral)   Ht 5\' 9"  (1.753 m)   Wt 259 lb (117.5 kg)   BMI 38.25 kg/m   Wt Readings from Last 3 Encounters:  01/08/17 259 lb (117.5 kg)  04/25/16 259 lb (117.5 kg)  02/27/16 259 lb 6.4 oz (117.7 kg)    Physical Exam  Constitutional: He is oriented to person, place, and time. He appears  well-developed and well-nourished. No distress.  Eyes: Conjunctivae are normal. No scleral icterus.  Cardiovascular: Normal rate, regular rhythm, normal heart sounds and intact distal pulses.   No murmur heard. Pulmonary/Chest: Effort normal and breath sounds normal. No respiratory distress. He has no wheezes. He has no rales.  Abdominal: Soft. Bowel sounds are normal. He exhibits no distension. There is no hepatosplenomegaly. There is no tenderness. There is no rigidity, no rebound, no guarding and no CVA tenderness. No hernia.  Musculoskeletal: Normal range of motion.  Neurological: He is alert and oriented to person, place, and time. Coordination normal.  Skin: Skin is warm and dry. No rash noted. He is not diaphoretic.  Psychiatric: He has a normal mood and affect. His behavior is normal.  Nursing note and vitals reviewed.  Influenza: Negative    Assessment & Plan:   Problem List Items Addressed This Visit    None    Visit Diagnoses    Viral gastroenteritis    -  Primary   Relevant Medications   ondansetron (ZOFRAN ODT) 4 MG disintegrating tablet   Other Relevant Orders   Veritor Flu A/B Waived      Use Tylenol and Zofran, it should not last more than 24-48 hours  Follow up plan: Return if symptoms worsen or fail to improve.  Counseling provided  for all of the vaccine components Orders Placed This Encounter  Procedures  . Veritor Flu A/B Waived    Arville Care, MD [facility] 01/08/2017, 9:36 AM

## 2017-02-04 ENCOUNTER — Ambulatory Visit (INDEPENDENT_AMBULATORY_CARE_PROVIDER_SITE_OTHER): Payer: Medicare Other | Admitting: Family Medicine

## 2017-02-04 ENCOUNTER — Encounter: Payer: Self-pay | Admitting: Family Medicine

## 2017-02-04 VITALS — BP 145/80 | HR 67 | Temp 98.1°F | Ht 69.0 in | Wt 256.0 lb

## 2017-02-04 DIAGNOSIS — M25562 Pain in left knee: Secondary | ICD-10-CM

## 2017-02-04 DIAGNOSIS — G8929 Other chronic pain: Secondary | ICD-10-CM | POA: Diagnosis not present

## 2017-02-04 DIAGNOSIS — M1712 Unilateral primary osteoarthritis, left knee: Secondary | ICD-10-CM | POA: Diagnosis not present

## 2017-02-04 MED ORDER — METHYLPREDNISOLONE ACETATE 80 MG/ML IJ SUSP: 40.0000 mg | Freq: Once | INTRAMUSCULAR | Status: DC

## 2017-02-04 NOTE — Assessment & Plan Note (Signed)
Acute on chronic.  Flare had actually been well controlled for the last 2 years after last corticosteroid injection.  2016 left knee x-ray report was reviewed which revealed degenerative changes and moderate osteoarthritis.  No right knee x-ray for comparison.  No evidence of ligamentous laxity or acute injury to the knee.  Pain is his typical posterior pain.  No evidence of Baker's cyst or DVT on exam.  Informed consent was provided.  Patient elected to proceed with corticosteroid injection and written consent was obtained and scanned into the chart.  Please see note for procedure.  He tolerated procedure well.  Signs and symptoms of infection were reviewed with the patient.  Handout was provided.  He will follow-up with PCP as needed.

## 2017-02-04 NOTE — Addendum Note (Signed)
Addended byDory Peru: RINTELMANN, GINA C on: 02/04/2017 02:26 PM   Modules accepted: Orders

## 2017-02-04 NOTE — Progress Notes (Signed)
Subjective: CC: Left knee pain PCP: Elenora GammaBradshaw, Samuel L, MD WUJ:WJXBJHPI:Jayant D Aundria Steven Serrano is a 71 y.o. male presenting to clinic today for:  1. Left knee pain Patient reports onset of current left-sided knee pain 10 days ago.  He actually reports that pain is more of a posterior knee pain, which was similar to previous.  He describes pain as sharp and not necessarily associated with activity.  Denies radiation of pain, numbness, tingling, weakness, falls.  Has been using a knee strap which applies pressure posteriorly with fair relief of symptoms.  He has not been taking any over-the-counter medications.  He has known moderate osteoarthritis with associated degenerative changes that were appreciated on September 2016 left knee x-ray.  At that time he also had a corticosteroid injection to the left knee, which he reports helped substantially.  Denies use of anticoagulation or allergy to anesthesia.  Patient is not diabetic.  No recent history of corticosteroid use.  No Known Allergies Past Medical History:  Diagnosis Date  . History of gastroesophageal reflux (GERD)   . History of ulcerative colitis    years ago  . Hyperlipidemia    x10 years  . Hypertension    x10 years  . Osteoarthritis   . Sleep apnea    Not treated with CPAP   Family History  Problem Relation Age of Onset  . Other Father        [facility]  . Clotting disorder Brother        Brain hemorrhage  . Heart disease Neg Hx        Early    Current Outpatient Medications:  .  atorvastatin (LIPITOR) 20 MG tablet, Take by mouth. Take 1/2 every evening, Disp: , Rfl:  .  lisinopril-hydrochlorothiazide (PRINZIDE,ZESTORETIC) 20-25 MG per tablet, Take 1 tablet by mouth daily., Disp: , Rfl:  .  omeprazole (PRILOSEC) 20 MG capsule, Take 20 mg by mouth daily.  , Disp: , Rfl:   Social Hx: daily smoker.  ROS: Per HPI  Objective: Office vital signs reviewed. BP (!) 145/80   Pulse 67   Temp 98.1 F (36.7 C) (Oral)    Ht 5\' 9"  (1.753 m)   Wt 256 lb (116.1 kg)   BMI 37.80 kg/m   Physical Examination:  General: Awake, alert, well nourished, well appearing male, No acute distress Extremities: warm, well perfused, No edema, cyanosis or clubbing; +2 pulses bilaterally; calves are equal in girth.  No redness, swelling. MSK: normal gait and normal station  Left knee: Patient has full active range of motion.  He has no tenderness to palpation to the quad tendon or patellar tendon.  No tenderness to palpation to the patella or joint line.  No palpable bony abnormalities.  He does have mild tenderness to palpation to the posterior popliteal fossa.  There are no palpable masses.  There is no erythema, no joint effusion, no swelling of the knee.  Bony landmarks are easily identifiable.  No ligamentous laxity. Skin: dry; intact; no rashes or lesions Neuro: lower extremity strength 5/5 and light touch sensation grossly intact  JOINT INJECTION:  Patient denies allergy to antiseptics (including iodine) and anesthetics.  Patient denies h/o diabetes, frequent steroid use, use of blood thinners/ antiplatelets.  Patient was given informed consent and a signed copy has been placed in the chart. Appropriate time out was taken. Area prepped and draped in usual sterile fashion. Anatomic landmarks were identified and injection site was marked.  Ethyl chloride spray was  used to numb the area and 1 cc of methylprednisolone 40 mg/ml plus  3 cc of 1% lidocaine without epinephrine was injected into the left knee using a(n) anteriorlateral approach. The patient tolerated the procedure well and there were no immediate complications. Estimated blood loss is less than 5 cc.  Post procedure instructions were reviewed and handout outlining these instructions were provided to patient.  Assessment/ Plan: 71 y.o. male   Left knee pain Acute on chronic.  Flare had actually been well controlled for the last 2 years after last corticosteroid  injection.  2016 left knee x-ray report was reviewed which revealed degenerative changes and moderate osteoarthritis.  No right knee x-ray for comparison.  No evidence of ligamentous laxity or acute injury to the knee.  Pain is his typical posterior pain.  No evidence of Baker's cyst or DVT on exam.  Informed consent was provided.  Patient elected to proceed with corticosteroid injection and written consent was obtained and scanned into the chart.  Please see note for procedure.  He tolerated procedure well.  Signs and symptoms of infection were reviewed with the patient.  Handout was provided.  He will follow-up with PCP as needed.  Primary osteoarthritis of left knee    Follow up with PCP prn health care needs.  Raliegh IpAshly M Kinsey Karch, DO [facility] [phone]

## 2017-02-04 NOTE — Patient Instructions (Signed)
You were treated with a steroid injection in your left knee today.  We discussed the signs and symptoms of infection.  During the next 24 hours you can expect the left knee to be slightly more achy from the steroid.  After that time, you should notice marked improvement in symptoms.  Knee Injection, Care After Refer to this sheet in the next few weeks. These instructions provide you with information about caring for yourself after your procedure. Your health care provider may also give you more specific instructions. Your treatment has been planned according to current medical practices, but problems sometimes occur. Call your health care provider if you have any problems or questions after your procedure. What can I expect after the procedure? After the procedure, it is common to have:  Soreness.  Warmth.  Swelling.  You may have more pain, swelling, and warmth than you did before the injection. This reaction may last for about one day. Follow these instructions at home: Bathing  If you were given a bandage (dressing), keep it dry until your health care provider says it can be removed. Ask your health care provider when you can start showering or taking a bath. Managing pain, stiffness, and swelling  If directed, apply ice to the injection area: ? Put ice in a plastic bag. ? Place a towel between your skin and the bag. ? Leave the ice on for 20 minutes, 2-3 times per day.  Do not apply heat to your knee.  Raise the injection area above the level of your heart while you are sitting or lying down. Activity  Avoid strenuous activities for as long as directed by your health care provider. Ask your health care provider when you can return to your normal activities. General instructions  Take medicines only as directed by your health care provider.  Do not take aspirin or other over-the-counter medicines unless your health care provider says you can.  Check your injection site every  day for signs of infection. Watch for: ? Redness, swelling, or pain. ? Fluid, blood, or pus.  Follow your health care provider's instructions about dressing changes and removal. Contact a health care provider if:  You have symptoms at your injection site that last longer than two days after your procedure.  You have redness, swelling, or pain in your injection area.  You have fluid, blood, or pus coming from your injection site.  You have warmth in your injection area.  You have a fever.  Your pain is not controlled with medicine. Get help right away if:  Your knee turns very red.  Your knee becomes very swollen.  Your knee pain is severe. This information is not intended to replace advice given to you by your health care provider. Make sure you discuss any questions you have with your health care provider. Document Released: 03/31/2014 Document Revised: 11/14/2015 Document Reviewed: 01/18/2014 Elsevier Interactive Patient Education  [employer]2018 Elsevier Inc.

## 2017-09-11 DIAGNOSIS — H2513 Age-related nuclear cataract, bilateral: Secondary | ICD-10-CM | POA: Diagnosis not present

## 2017-09-11 DIAGNOSIS — H40033 Anatomical narrow angle, bilateral: Secondary | ICD-10-CM | POA: Diagnosis not present

## 2017-09-18 DIAGNOSIS — G44219 Episodic tension-type headache, not intractable: Secondary | ICD-10-CM | POA: Diagnosis not present

## 2018-03-04 ENCOUNTER — Ambulatory Visit: Payer: Medicare Other | Admitting: Family Medicine

## 2018-04-07 ENCOUNTER — Encounter: Payer: Self-pay | Admitting: Pediatrics

## 2018-04-07 ENCOUNTER — Ambulatory Visit (INDEPENDENT_AMBULATORY_CARE_PROVIDER_SITE_OTHER): Payer: Medicare Other | Admitting: Pediatrics

## 2018-04-07 VITALS — BP 148/75 | HR 66 | Temp 97.1°F | Ht 69.0 in | Wt 261.0 lb

## 2018-04-07 DIAGNOSIS — J40 Bronchitis, not specified as acute or chronic: Secondary | ICD-10-CM | POA: Diagnosis not present

## 2018-04-07 DIAGNOSIS — J069 Acute upper respiratory infection, unspecified: Secondary | ICD-10-CM | POA: Diagnosis not present

## 2018-04-07 DIAGNOSIS — R05 Cough: Secondary | ICD-10-CM | POA: Diagnosis not present

## 2018-04-07 DIAGNOSIS — R059 Cough, unspecified: Secondary | ICD-10-CM

## 2018-04-07 MED ORDER — FLUTICASONE PROPIONATE 50 MCG/ACT NA SUSP: 2.0000 | Freq: Every day | NASAL | 6 refills | Status: DC

## 2018-04-07 MED ORDER — PREDNISONE 20 MG PO TABS: ORAL_TABLET | ORAL | 0 refills | Status: DC

## 2018-04-07 MED ORDER — GUAIFENESIN-CODEINE 100-10 MG/5ML PO SOLN: 5.0000 mL | Freq: Three times a day (TID) | ORAL | 0 refills | Status: DC | PRN

## 2018-04-07 MED ORDER — CETIRIZINE HCL 10 MG PO TABS: 10.0000 mg | ORAL_TABLET | Freq: Every day | ORAL | 11 refills | Status: AC

## 2018-04-07 NOTE — Progress Notes (Signed)
Subjective:   Patient ID: Steven Serrano, male    DOB: [birth year], 73 y.o.   MRN: [id number] CC: Cough (Non productive) and Back Pain  HPI: Steven Serrano is a 73 y.o. male   Started having cough 3 days ago.  Has had several weeks to months of sinus drainage.  No sore throat.  Sinus drainage is gotten worse over the last 3 days.  No fevers.  Appetite is been fine.  No sore throat.  Smokes half pack to a full pack daily.  He has tried Sudafed, over-the-counter allergy treatment combination medicine.  Cough bothering the most.  Keeping him awake at night.  Back pain: Bothers him when he is walking for distances.  If he is using a cart and able to lean over such as at [facility] back pain does not bother him as much.  Was following at the [facility] for primary care.  Wants to switch care to here.  Relevant past medical, surgical, family and social history reviewed. Allergies and medications reviewed and updated. Social History   Tobacco Use  Smoking Status Current Every Day Smoker  . Packs/day: 0.50  . Types: Cigarettes  Smokeless Tobacco Never Used  Tobacco Comment   Has been smoking cigarettes, most recently for 6 years. He quit for 14 years prior to this and smoked for many years before that. Currently smoking half a pack a day.   ROS: Per HPI   Objective:    BP (!) 148/75   Pulse 66   Temp (!) 97.1 F (36.2 C) (Oral)   Ht _0  (1.753 m)   Wt 261 lb (118.4 kg)   SpO2 97%   BMI 38.54 kg/m   Wt Readings from Last 3 Encounters:  04/07/18 261 lb (118.4 kg)  02/04/17 256 lb (116.1 kg)  01/08/17 259 lb (117.5 kg)    Gen: NAD, alert, cooperative with exam, NCAT EYES: EOMI, no conjunctival injection, or no icterus ENT:  TMs pearly gray b/l, OP without erythema LYMPH: no cervical LAD CV: NRRR, normal S1/S2, no murmur, distal pulses 2+ b/l Resp: Moving air well, slightly prolonged expiratory phase, dry cough present, comfortable WOB Abd: +BS, soft, NTND. no guarding or  organomegaly Ext: No edema, warm Neuro: Alert and oriented, strength equal b/l UE and LE, coordination grossly normal MSK: normal muscle bulk  Assessment & Plan:  [name] was seen today for cough and back pain.  Diagnoses and all orders for this visit:  Acute URI Symptom care and return precautions discussed.  If not improving, start albuterol.  Do not take prescription cough medicine and drive.  Will cause drowsiness. -     CMP14+EGFR -     guaiFENesin-codeine 100-10 MG/5ML syrup; Take 5-10 mLs by mouth 3 (three) times daily as needed for cough. Do not take and drive. Can cause drowsiness. -     fluticasone (FLONASE) 50 MCG/ACT nasal spray; Place 2 sprays into both nostrils daily. -     cetirizine (ZYRTEC) 10 MG tablet; Take 1 tablet (10 mg total) by mouth daily.  Bronchitis -     predniSONE (DELTASONE) 20 MG tablet; 2 po at same time daily for 3 days -     CMP14+EGFR -     guaiFENesin-codeine 100-10 MG/5ML syrup; Take 5-10 mLs by mouth 3 (three) times daily as needed for cough. Do not take and drive. Can cause drowsiness. -     fluticasone (FLONASE) 50 MCG/ACT nasal spray; Place 2 sprays into both nostrils daily. -  cetirizine (ZYRTEC) 10 MG tablet; Take 1 tablet (10 mg total) by mouth daily.  Cough -     guaiFENesin-codeine 100-10 MG/5ML syrup; Take 5-10 mLs by mouth 3 (three) times daily as needed for cough. Do not take and drive. Can cause drowsiness.   Follow up plan: Return for 2 weeks for back pain. Assunta Found, MD [place]

## 2018-04-07 NOTE — Patient Instructions (Signed)
Fever reducer and headache: tylenol   Sinus pressure:  Nasal steroid such as flonase/fluticaone or nasocort daily Can also take daily antihistamine such as loratadine/claritin or cetirizine/zyrtec  Sinus rinses/irritation: Netipot or similar with distilled water 2-3 times a day to clear out sinuses or Normal saline nasal spray  Sore throat:  Throat lozenges chloroseptic spray  Stick with bland foods Drink lots of fluids  

## 2018-04-08 LAB — CMP14+EGFR
ALBUMIN: 4.2 g/dL (ref 3.5–4.8)
ALK PHOS: 71 IU/L (ref 39–117)
ALT: 53 IU/L — ABNORMAL HIGH (ref 0–44)
AST: 44 IU/L — ABNORMAL HIGH (ref 0–40)
Albumin/Globulin Ratio: 1.8 (ref 1.2–2.2)
BUN / CREAT RATIO: 15 (ref 10–24)
BUN: 14 mg/dL (ref 8–27)
Bilirubin Total: 0.7 mg/dL (ref 0.0–1.2)
CO2: 21 mmol/L (ref 20–29)
CREATININE: 0.96 mg/dL (ref 0.76–1.27)
Calcium: 9.4 mg/dL (ref 8.6–10.2)
Chloride: 101 mmol/L (ref 96–106)
GFR, EST AFRICAN AMERICAN: 91 mL/min/{1.73_m2} (ref >59)
GFR, EST NON AFRICAN AMERICAN: 79 mL/min/{1.73_m2} (ref >59)
GLOBULIN, TOTAL: 2.3 g/dL (ref 1.5–4.5)
GLUCOSE: 92 mg/dL (ref 65–99)
Potassium: 4.1 mmol/L (ref 3.5–5.2)
SODIUM: 138 mmol/L (ref 134–144)
TOTAL PROTEIN: 6.5 g/dL (ref 6.0–8.5)

## 2018-04-23 ENCOUNTER — Ambulatory Visit: Payer: Medicare Other | Admitting: Pediatrics

## 2018-04-28 ENCOUNTER — Ambulatory Visit: Payer: Medicare Other | Admitting: Family Medicine

## 2018-08-06 ENCOUNTER — Telehealth: Payer: Self-pay | Admitting: Family Medicine

## 2018-08-16 ENCOUNTER — Encounter: Payer: Self-pay | Admitting: Gastroenterology

## 2018-08-23 ENCOUNTER — Ambulatory Visit (INDEPENDENT_AMBULATORY_CARE_PROVIDER_SITE_OTHER): Payer: Medicare Other | Admitting: *Deleted

## 2018-08-23 ENCOUNTER — Other Ambulatory Visit: Payer: Self-pay

## 2018-08-23 DIAGNOSIS — Z0000 Encounter for general adult medical examination without abnormal findings: Secondary | ICD-10-CM | POA: Diagnosis not present

## 2018-08-23 NOTE — Progress Notes (Signed)
MEDICARE ANNUAL WELLNESS VISIT  08/23/2018  Telephone Visit Disclaimer This Medicare AWV was conducted by telephone due to national recommendations for restrictions regarding the COVID-19 Pandemic (e.g. social distancing).  I verified, using two identifiers, that I am speaking with Steven Serrano or their authorized healthcare agent. I discussed the limitations, risks, security, and privacy concerns of performing an evaluation and management service by telephone and the potential availability of an in-person appointment in the future. The patient expressed understanding and agreed to proceed.   Subjective:  Steven Serrano is a 73 y.o. male patient of Steven Ip, DO who had a Medicare Annual Wellness Visit today via telephone. Steven Serrano is Working part time and lives with their spouse. he has 0 children. he reports that he is socially active and does interact with friends/family regularly. he is minimally physically active and enjoys playing golf which he does 5 times a week.  Patient Care Team: Steven Ip, DO as PCP - General (Family Medicine)  Advanced Directives 08/23/2018  Does Patient Have a Medical Advance Directive? No  Would patient like information on creating a medical advance directive? No - Patient declined    Hospital Utilization Over the Past 12 Months: # of hospitalizations or ER visits: 0 # of surgeries: 0  Review of Systems    Patient reports that his overall health is unchanged compared to last year.  Patient Reported Readings (BP, Pulse, CBG, Weight, etc) none  Review of Systems: No complaints.  All other systems negative.  Pain Assessment Pain : No/denies pain     Current Medications & Allergies (verified) Allergies as of 08/23/2018   No Known Allergies     Medication List       Accurate as of August 23, 2018  3:52 PM. If you have any questions, ask your nurse or doctor.        STOP taking these medications   fluticasone 50 MCG/ACT  nasal spray Commonly known as:  FLONASE   guaiFENesin-codeine 100-10 MG/5ML syrup   predniSONE 20 MG tablet Commonly known as:  DELTASONE     TAKE these medications   atorvastatin 20 MG tablet Commonly known as:  LIPITOR Take by mouth. Take 1/2 every evening   cetirizine 10 MG tablet Commonly known as:  ZYRTEC Take 1 tablet (10 mg total) by mouth daily.   lisinopril-hydrochlorothiazide 20-25 MG tablet Commonly known as:  ZESTORETIC Take 1 tablet by mouth daily.   omeprazole 20 MG capsule Commonly known as:  PRILOSEC Take 20 mg by mouth daily.       History (reviewed): Past Medical History:  Diagnosis Date  . GERD (gastroesophageal reflux disease)   . History of gastroesophageal reflux (GERD)   . History of ulcerative colitis    years ago  . Hyperlipidemia    x10 years  . Hypertension    x10 years  . Osteoarthritis   . Sleep apnea    Not treated with CPAP   Past Surgical History:  Procedure Laterality Date  . SPINE SURGERY  2010   spinal stenosis-lumbar    Family History  Problem Relation Age of Onset  . Other Father        [facility] spotted fever  . Clotting disorder Brother        Brain hemorrhage  . Heart disease Neg Hx        Early   Social History   Socioeconomic History  . Marital status: Married    Spouse  name: Steven Serrano  . Number of children: 0  . Years of education: 5612  . Highest education level: High school graduate  Occupational History  . Occupation: Visual merchandiserervice advisor at an [employer]    Comment: works 2 days a week   Social Needs  . Financial resource strain: Not hard at all  . Food insecurity:    Worry: Never true    Inability: Never true  . Transportation needs:    Medical: No    Non-medical: No  Tobacco Use  . Smoking status: Current Every Day Smoker    Packs/day: 0.50    Years: 19.00    Pack years: 9.50    Types: Cigarettes  . Smokeless tobacco: Never Used  . Tobacco comment: Has been smoking cigarettes, most  recently for 6 years. He quit for 14 years prior to this and smoked for many years before that. Currently smoking half a pack a day.  Substance and Sexual Activity  . Alcohol use: Yes    Alcohol/week: 4.0 standard drinks    Types: 4 Cans of beer per week  . Drug use: Never  . Sexual activity: Not Currently    Birth control/protection: None  Lifestyle  . Physical activity:    Days per week: 5 days    Minutes per session: 30 min  . Stress: Not at all  Relationships  . Social connections:    Talks on phone: More than three times a week    Gets together: More than three times a week    Attends religious service: Never    Active member of club or organization: No    Attends meetings of clubs or organizations: Never    Relationship status: Married  Other Topics Concern  . Not on file  Social History Narrative   Married    Activities of Daily Living In your present state of health, do you have any difficulty performing the following activities: 08/23/2018  Hearing? N  Vision? N  Difficulty concentrating or making decisions? N  Walking or climbing stairs? N  Dressing or bathing? N  Doing errands, shopping? N  Preparing Food and eating ? N  Using the Toilet? N  In the past six months, have you accidently leaked urine? N  Do you have problems with loss of bowel control? N  Managing your Medications? N  Managing your Finances? N  Housekeeping or managing your Housekeeping? N  Some recent data might be hidden    Patient Literacy How often do you need to have someone help you when you read instructions, pamphlets, or other written materials from your doctor or pharmacy?: 1 - Never What is the last grade level you completed in school?: 12th grade  Exercise Current Exercise Habits: The patient does not participate in regular exercise at present(pt plays golf 5 times a week), Exercise limited by: None identified  Diet Patient reports consuming 2 meals a day and 1 snack(s) a day  Patient reports that his primary diet is: Regular Patient reports that she does have regular access to food.   Depression Screen PHQ 2/9 Scores 08/23/2018 02/04/2017 01/08/2017 04/25/2016 02/27/2016 12/08/2014  PHQ - 2 Score 0 0 0 0 0 0     Fall Risk Fall Risk  08/23/2018 02/04/2017 01/08/2017 04/25/2016 02/27/2016  Falls in the past year? 0 No No No No     Objective:  Steven CaprioJerry D Serrano seemed alert and oriented and he participated appropriately during our telephone visit.  Blood Pressure Weight BMI  BP Readings from Last 3 Encounters:  04/07/18 (!) 148/75  02/04/17 (!) 145/80  01/08/17 118/74   Wt Readings from Last 3 Encounters:  04/07/18 261 lb (118.4 kg)  02/04/17 256 lb (116.1 kg)  01/08/17 259 lb (117.5 kg)   BMI Readings from Last 1 Encounters:  04/07/18 38.54 kg/m    *Unable to obtain current vital signs, weight, and BMI due to telephone visit type  Hearing/Vision  . Josey did not seem to have difficulty with hearing/understanding during the telephone conversation . Reports that he has not had a formal eye exam by an eye care professional within the past year . Reports that he has not had a formal hearing evaluation within the past year *Unable to fully assess hearing and vision during telephone visit type  Cognitive Function: 6CIT Screen 08/23/2018  What Year? 0 points  What month? 0 points  What time? 0 points  Count back from 20 0 points  Months in reverse 0 points  Repeat phrase 0 points  Total Score 0    Normal Cognitive Function Screening: Yes (Normal:0-7, Significant for Dysfunction: >8)  Immunization & Health Maintenance Record  There is no immunization history on file for this patient.  Health Maintenance  Topic Date Due  . Hepatitis C Screening  January 19, 1946  . TETANUS/TDAP  03/23/1965  . PNA vac Low Risk Adult (1 of 2 - PCV13) 03/24/2011  . COLONOSCOPY  10/29/2020  . INFLUENZA VACCINE  Discontinued       Assessment  This is a routine wellness  examination for Steven Serrano.  Health Maintenance: Due or Overdue Health Maintenance Due  Topic Date Due  . Hepatitis C Screening  May 14, 1945  . TETANUS/TDAP  03/23/1965  . PNA vac Low Risk Adult (1 of 2 - PCV13) 03/24/2011    Steven Serrano does not need a referral for Community Assistance: Care Management:   no Social Work:    no Prescription Assistance:  no Nutrition/Diabetes Education:  no   Plan:  Personalized Goals Goals Addressed            This Visit's Progress   . DIET - DECREASE SODA OR JUICE INTAKE (pt-stated)       Pt drinks dt mt dew and tea      Personalized Health Maintenance & Screening Recommendations  pt states he gets all of his Immunizations from the [place]   Lung Cancer Screening Recommended: not applicable (Low Dose CT Chest recommended if Age 79-80 years, 30 pack-year currently smoking OR have quit w/in past 15 years) Hepatitis C Screening recommended: yes-pt thinks he had this done at the [place] HIV Screening recommended: no  Advanced Directives: Written information was not prepared per patient's request.  Referrals & Orders No orders of the defined types were placed in this encounter.   Follow-up Plan . Follow-up with Steven Ip, DO as planned . Try to go records from [place] regarding your Immunizations and get [place] a copy for our records.    I have personally reviewed and noted the following in the patient's chart:   . Medical and social history . Use of alcohol, tobacco or illicit drugs  . Current medications and supplements . Functional ability and status . Nutritional status . Physical activity . Advanced directives . List of other physicians . Hospitalizations, surgeries, and ER visits in previous 12 months . Vitals . Screenings to include cognitive, depression, and falls . Referrals and appointments  In addition, I have reviewed and discussed with  Steven Serrano certain preventive protocols, quality metrics, and best practice  recommendations. A written personalized care plan for preventive services as well as general preventive health recommendations is available and can be mailed to the patient at his request.      Margurite Auerbach  08/23/2018

## 2018-08-23 NOTE — Patient Instructions (Signed)
Preventive Care 2 Years and Older, Male Preventive care refers to lifestyle choices and visits with your health care provider that can promote health and wellness. What does preventive care include?   A yearly physical exam. This is also called an annual well check.  Dental exams once or twice a year.  Routine eye exams. Ask your health care provider how often you should have your eyes checked.  Personal lifestyle choices, including: ? Daily care of your teeth and gums. ? Regular physical activity. ? Eating a healthy diet. ? Avoiding tobacco and drug use. ? Limiting alcohol use. ? Practicing safe sex. ? Taking low doses of aspirin every day. ? Taking vitamin and mineral supplements as recommended by your health care provider. What happens during an annual well check? The services and screenings done by your health care provider during your annual well check will depend on your age, overall health, lifestyle risk factors, and family history of disease. Counseling Your health care provider may ask you questions about your:  Alcohol use.  Tobacco use.  Drug use.  Emotional well-being.  Home and relationship well-being.  Sexual activity.  Eating habits.  History of falls.  Memory and ability to understand (cognition).  Work and work Statistician. Screening You may have the following tests or measurements:  Height, weight, and BMI.  Blood pressure.  Lipid and cholesterol levels. These may be checked every 5 years, or more frequently if you are over 9 years old.  Skin check.  Lung cancer screening. You may have this screening every year starting at age 57 if you have a 30-pack-year history of smoking and currently smoke or have quit within the past 15 years.  Colorectal cancer screening. All adults should have this screening starting at age 90 and continuing until age 69. You will have tests every 1-10 years, depending on your results and the type of screening  test. People at increased risk should start screening at an earlier age. Screening tests may include: ? Guaiac-based fecal occult blood testing. ? Fecal immunochemical test (FIT). ? Stool DNA test. ? Virtual colonoscopy. ? Sigmoidoscopy. During this test, a flexible tube with a tiny camera (sigmoidoscope) is used to examine your rectum and lower colon. The sigmoidoscope is inserted through your anus into your rectum and lower colon. ? Colonoscopy. During this test, a long, thin, flexible tube with a tiny camera (colonoscope) is used to examine your entire colon and rectum.  Prostate cancer screening. Recommendations will vary depending on your family history and other risks.  Hepatitis C blood test.  Hepatitis B blood test.  Sexually transmitted disease (STD) testing.  Diabetes screening. This is done by checking your blood sugar (glucose) after you have not eaten for a while (fasting). You may have this done every 1-3 years.  Abdominal aortic aneurysm (AAA) screening. You may need this if you are a current or former smoker.  Osteoporosis. You may be screened starting at age 30 if you are at high risk. Talk with your health care provider about your test results, treatment options, and if necessary, the need for more tests. Vaccines Your health care provider may recommend certain vaccines, such as:  Influenza vaccine. This is recommended every year.  Tetanus, diphtheria, and acellular pertussis (Tdap, Td) vaccine. You may need a Td booster every 10 years.  Varicella vaccine. You may need this if you have not been vaccinated.  Zoster vaccine. You may need this after age 42.  Measles, mumps, and rubella (MMR) vaccine.  You may need at least one dose of MMR if you were born in 1957 or later. You may also need a second dose.  Pneumococcal 13-valent conjugate (PCV13) vaccine. One dose is recommended after age 65.  Pneumococcal polysaccharide (PPSV23) vaccine. One dose is recommended  after age 65.  Meningococcal vaccine. You may need this if you have certain conditions.  Hepatitis A vaccine. You may need this if you have certain conditions or if you travel or work in places where you may be exposed to hepatitis A.  Hepatitis B vaccine. You may need this if you have certain conditions or if you travel or work in places where you may be exposed to hepatitis B.  Haemophilus influenzae type b (Hib) vaccine. You may need this if you have certain risk factors. Talk to your health care provider about which screenings and vaccines you need and how often you need them. This information is not intended to replace advice given to you by your health care provider. Make sure you discuss any questions you have with your health care provider. Document Released: 04/06/2015 Document Revised: 04/30/2017 Document Reviewed: 01/09/2015 Elsevier Interactive Patient Education  2019 [employer]  

## 2019-03-28 DIAGNOSIS — M545 Low back pain: Secondary | ICD-10-CM | POA: Diagnosis not present

## 2019-03-28 DIAGNOSIS — M5136 Other intervertebral disc degeneration, lumbar region: Secondary | ICD-10-CM | POA: Diagnosis not present

## 2019-03-28 DIAGNOSIS — M5417 Radiculopathy, lumbosacral region: Secondary | ICD-10-CM | POA: Diagnosis not present

## 2019-05-18 ENCOUNTER — Other Ambulatory Visit: Payer: Self-pay | Admitting: *Deleted

## 2019-05-18 DIAGNOSIS — J40 Bronchitis, not specified as acute or chronic: Secondary | ICD-10-CM

## 2019-05-18 DIAGNOSIS — J069 Acute upper respiratory infection, unspecified: Secondary | ICD-10-CM

## 2020-04-25 DIAGNOSIS — M5136 Other intervertebral disc degeneration, lumbar region: Secondary | ICD-10-CM | POA: Diagnosis not present

## 2020-10-18 ENCOUNTER — Ambulatory Visit (INDEPENDENT_AMBULATORY_CARE_PROVIDER_SITE_OTHER): Payer: Medicare Other

## 2020-10-18 ENCOUNTER — Encounter: Payer: Self-pay | Admitting: Family Medicine

## 2020-10-18 ENCOUNTER — Ambulatory Visit (INDEPENDENT_AMBULATORY_CARE_PROVIDER_SITE_OTHER): Payer: Medicare Other | Admitting: Family Medicine

## 2020-10-18 ENCOUNTER — Other Ambulatory Visit: Payer: Self-pay

## 2020-10-18 VITALS — BP 148/66 | HR 75 | Temp 98.2°F | Ht 69.0 in | Wt 251.6 lb

## 2020-10-18 DIAGNOSIS — M25512 Pain in left shoulder: Secondary | ICD-10-CM | POA: Diagnosis not present

## 2020-10-18 MED ORDER — BETAMETHASONE SOD PHOS & ACET 6 (3-3) MG/ML IJ SUSP
6.0000 mg | Freq: Once | INTRAMUSCULAR | Status: AC
  Administered 2020-10-18: 6 mg via INTRAMUSCULAR

## 2020-10-18 NOTE — Patient Instructions (Signed)
Osteo biflex should help with arthritis pain. Available at most pharmcies without needing a prescription

## 2020-10-18 NOTE — Progress Notes (Signed)
Chief Complaint  Patient presents with   Shoulder Pain    left    HPI  Patient presents today for pain in left shoulder off and on for several months. Playing golf 5 times a week. Noticed that it has gotten much worse over the last week. Swinging the club causes pain with abduction of the left shoulder. Pt. Points to the anterior AC area. Occasional pain turning the steering wheel driving. Only hurts during abduction only.   PMH: Smoking status noted ROS: Per HPI  Objective: BP (!) 148/66   Pulse 75   Temp 98.2 F (36.8 C)   Ht 5\' 9"  (1.753 m)   Wt 251 lb 9.6 oz (114.1 kg)   SpO2 96%   BMI 37.15 kg/m  Gen: NAD, alert, cooperative with exam Ext: No edema, warm.  There is tenderness about the anterior aspect of the [facility] joint.  This is exacerbated by passive abduction and active abduction of the shoulder.  There is no pain or tenderness with rotation.  There is no erythema.  No masses. Neuro: Alert and oriented, No gross deficits X-ray: Left shoulder: No acute abnormality noted.  There is degenerative change at the [facility] joint. Assessment and plan:  1. Left shoulder pain, unspecified chronicity     Meds ordered this encounter  Medications   betamethasone acetate-betamethasone sodium phosphate (CELESTONE) injection 6 mg     Orders Placed This Encounter  Procedures   DG Shoulder Left    Standing Status:   Future    Number of Occurrences:   1    Standing Expiration Date:   11/18/2020    Order Specific Question:   Reason for Exam (SYMPTOM  OR DIAGNOSIS REQUIRED)    Answer:   painful abduction at the anterior [facility] area    Order Specific Question:   Preferred imaging location?    Answer:   Internal    Order Specific Question:   Release to patient    Answer:   Immediate    Follow up as needed.  [facility], MD

## 2020-12-07 ENCOUNTER — Encounter: Payer: Self-pay | Admitting: Nurse Practitioner

## 2020-12-07 ENCOUNTER — Ambulatory Visit (INDEPENDENT_AMBULATORY_CARE_PROVIDER_SITE_OTHER): Payer: Medicare Other | Admitting: Nurse Practitioner

## 2020-12-07 DIAGNOSIS — R059 Cough, unspecified: Secondary | ICD-10-CM

## 2020-12-07 MED ORDER — BENZONATATE 100 MG PO CAPS: 100.0000 mg | ORAL_CAPSULE | Freq: Three times a day (TID) | ORAL | 0 refills | Status: AC | PRN

## 2020-12-07 NOTE — Progress Notes (Signed)
   Virtual Visit  Note Due to COVID-19 pandemic this visit was conducted virtually. This visit type was conducted due to national recommendations for restrictions regarding the COVID-19 Pandemic (e.g. social distancing, sheltering in place) in an effort to limit this patient's exposure and mitigate transmission in our community. All issues noted in this document were discussed and addressed.  A physical exam was not performed with this format.  I connected with Steven Serrano on 12/07/20 at 3:00 pm by telephone and verified that I am speaking with the correct person using two identifiers. Steven Serrano is currently located at home during visit. The provider, Daryll Drown, NP is located in their office at time of visit.  I discussed the limitations, risks, security and privacy concerns of performing an evaluation and management service by telephone and the availability of in person appointments. I also discussed with the patient that there may be a patient responsible charge related to this service. The patient expressed understanding and agreed to proceed.   History and Present Illness:  Cough This is a new problem. Episode onset: 3 days. The problem has been unchanged. The problem occurs constantly. The cough is Non-productive. Pertinent negatives include no chest pain, chills, ear congestion, fever, headaches, heartburn, hemoptysis, nasal congestion or rash. Nothing aggravates the symptoms. He has tried nothing for the symptoms.     Review of Systems  Constitutional:  Negative for chills and fever.  Respiratory:  Positive for cough. Negative for hemoptysis.   Cardiovascular:  Negative for chest pain.  Gastrointestinal:  Negative for heartburn.  Skin:  Negative for rash.  Neurological:  Negative for headaches.  All other systems reviewed and are negative.   Observations/Objective: Tele- visit, patient not in distress  Assessment and Plan: Patient at home covid-19 test negative.   Take meds as prescribed - Use a cool mist humidifier  -Use saline nose sprays frequently -Force fluids -For fever or aches or pains- take Tylenol or ibuprofen. -If symptoms do not improve, she may need to be COVID tested to rule this out Benzonate 100 mg by mouth, RX sent to pharmacy  Follow Up Instructions: Follow up with worsening unresolved symptoms    I discussed the assessment and treatment plan with the patient. The patient was provided an opportunity to ask questions and all were answered. The patient agreed with the plan and demonstrated an understanding of the instructions.   The patient was advised to call back or seek an in-person evaluation if the symptoms worsen or if the condition fails to improve as anticipated.  The above assessment and management plan was discussed with the patient. The patient verbalized understanding of and has agreed to the management plan. Patient is aware to call the clinic if symptoms persist or worsen. Patient is aware when to return to the clinic for a follow-up visit. Patient educated on when it is appropriate to go to the emergency department.   Time call ended:  3:10 PM  I provided 10 minutes of  non face-to-face time during this encounter.    Daryll Drown, NP

## 2020-12-07 NOTE — Assessment & Plan Note (Signed)
Patient at home covid-19 test negative.  Take meds as prescribed - Use a cool mist humidifier  -Use saline nose sprays frequently -Force fluids -For fever or aches or pains- take Tylenol or ibuprofen. -If symptoms do not improve, she may need to be COVID tested to rule this out Benzonate 100 mg by mouth, RX sent to pharmacy

## 2020-12-12 ENCOUNTER — Telehealth: Payer: Self-pay | Admitting: Family Medicine

## 2020-12-12 DIAGNOSIS — R059 Cough, unspecified: Secondary | ICD-10-CM | POA: Diagnosis not present

## 2020-12-12 DIAGNOSIS — J0100 Acute maxillary sinusitis, unspecified: Secondary | ICD-10-CM | POA: Diagnosis not present

## 2020-12-12 NOTE — Telephone Encounter (Signed)
[email or web address]

## 2020-12-12 NOTE — Telephone Encounter (Signed)
Pt is calling back because he is still having symptoms (cough and sinus drainage). Wants to be seen in the office because he said the medicine (benzonatate (TESSALON PERLES) 100 MG capsule)  that was called in on 9/16 is not helping. Please call back and advise.

## 2020-12-19 NOTE — Telephone Encounter (Signed)
Unable to reach patient, per chart patient went to urgent care on 12/12/20 for same.

## 2021-02-11 DIAGNOSIS — M25512 Pain in left shoulder: Secondary | ICD-10-CM | POA: Diagnosis not present

## 2021-02-11 DIAGNOSIS — M545 Low back pain, unspecified: Secondary | ICD-10-CM | POA: Diagnosis not present

## 2022-02-05 DIAGNOSIS — M47816 Spondylosis without myelopathy or radiculopathy, lumbar region: Secondary | ICD-10-CM | POA: Diagnosis not present

## 2022-02-05 DIAGNOSIS — I10 Essential (primary) hypertension: Secondary | ICD-10-CM | POA: Diagnosis not present

## 2022-02-05 DIAGNOSIS — M545 Low back pain, unspecified: Secondary | ICD-10-CM | POA: Diagnosis not present

## 2022-02-05 DIAGNOSIS — M4316 Spondylolisthesis, lumbar region: Secondary | ICD-10-CM | POA: Diagnosis not present

## 2022-02-05 DIAGNOSIS — M5136 Other intervertebral disc degeneration, lumbar region: Secondary | ICD-10-CM | POA: Diagnosis not present

## 2022-02-06 DIAGNOSIS — I10 Essential (primary) hypertension: Secondary | ICD-10-CM | POA: Diagnosis not present

## 2022-02-18 DIAGNOSIS — R937 Abnormal findings on diagnostic imaging of other parts of musculoskeletal system: Secondary | ICD-10-CM | POA: Diagnosis not present

## 2022-02-18 DIAGNOSIS — I10 Essential (primary) hypertension: Secondary | ICD-10-CM | POA: Diagnosis not present

## 2022-02-18 DIAGNOSIS — M545 Low back pain, unspecified: Secondary | ICD-10-CM | POA: Diagnosis not present

## 2022-03-04 DIAGNOSIS — M47816 Spondylosis without myelopathy or radiculopathy, lumbar region: Secondary | ICD-10-CM | POA: Diagnosis not present

## 2022-03-04 DIAGNOSIS — R937 Abnormal findings on diagnostic imaging of other parts of musculoskeletal system: Secondary | ICD-10-CM | POA: Diagnosis not present

## 2022-03-04 DIAGNOSIS — M47817 Spondylosis without myelopathy or radiculopathy, lumbosacral region: Secondary | ICD-10-CM | POA: Diagnosis not present

## 2022-03-04 DIAGNOSIS — M545 Low back pain, unspecified: Secondary | ICD-10-CM | POA: Diagnosis not present

## 2022-03-04 DIAGNOSIS — M2578 Osteophyte, vertebrae: Secondary | ICD-10-CM | POA: Diagnosis not present

## 2022-03-04 DIAGNOSIS — M4316 Spondylolisthesis, lumbar region: Secondary | ICD-10-CM | POA: Diagnosis not present

## 2022-03-27 DIAGNOSIS — Z0000 Encounter for general adult medical examination without abnormal findings: Secondary | ICD-10-CM | POA: Diagnosis not present

## 2022-03-27 DIAGNOSIS — I10 Essential (primary) hypertension: Secondary | ICD-10-CM | POA: Diagnosis not present

## 2022-05-06 DIAGNOSIS — M545 Low back pain, unspecified: Secondary | ICD-10-CM | POA: Diagnosis not present

## 2022-05-06 DIAGNOSIS — M7710 Lateral epicondylitis, unspecified elbow: Secondary | ICD-10-CM | POA: Diagnosis not present
